# Patient Record
Sex: Female | Born: 2000 | Race: White | Hispanic: No | Marital: Single | Smoking: Never smoker
Health system: Southern US, Community
[De-identification: ages and names within clinical notes are randomized; demographics above are authoritative.]

---
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
## 2019-01-02 ENCOUNTER — Ambulatory Visit (INDEPENDENT_AMBULATORY_CARE_PROVIDER_SITE_OTHER): Payer: Managed Care, Other (non HMO)

## 2019-01-02 ENCOUNTER — Ambulatory Visit (INDEPENDENT_AMBULATORY_CARE_PROVIDER_SITE_OTHER): Payer: Managed Care, Other (non HMO) | Admitting: Cardiovascular Disease

## 2019-01-02 ENCOUNTER — Other Ambulatory Visit: Payer: Self-pay

## 2019-01-02 ENCOUNTER — Encounter: Payer: Self-pay | Admitting: Cardiovascular Disease

## 2019-01-02 VITALS — BP 128/80 | HR 75 | Ht 62.0 in | Wt 116.8 lb

## 2019-01-02 DIAGNOSIS — R002 Palpitations: Secondary | ICD-10-CM

## 2019-01-02 NOTE — Patient Instructions (Signed)
Medication Instructions:  Your physician recommends that you continue on your current medications as directed. Please refer to the Current Medication list given to you today.   If you need a refill on your cardiac medications before your next appointment, please call your pharmacy.   Lab work: None ordered If you have labs (blood work) drawn today and your tests are completely normal, you will receive your results only by: Marland Kitchen MyChart Message (if you have MyChart) OR . A paper copy in the mail If you have any lab test that is abnormal or we need to change your treatment, we will call you to review the results.  Testing/Procedures: Your physician has recommended that you wear an zio monitor. Zio monitors are medical devices that record the heart's electrical activity. Doctors most often [place] these monitors to diagnose arrhythmias. Arrhythmias are problems with the speed or rhythm of the heartbeat. The monitor is a small, portable device. You can wear one while you do your normal daily activities. This is usually used to diagnose what is causing palpitations/syncope (passing out).    Follow-Up: At [facility], you and your health needs are our priority.  As part of our continuing mission to provide you with exceptional heart care, we have created designated Provider Care Teams.  These Care Teams include your primary Cardiologist (physician) and Advanced Practice Providers (APPs -  Physician Assistants and Nurse Practitioners) who all work together to provide you with the care you need, when you need it. You will need a follow up appointment as needed    You may see Dr. Fletcher Anon or one of the following Advanced Practice Providers on your designated Care Team:   Murray Hodgkins, NP Christell Faith, PA-C . Marrianne Mood, PA-C  Any Other Special Instructions Will Be Listed Below (If Applicable).  OK to exercise with no limitations.      Your physician has recommended that you wear a Zio  monitor. This monitor is a medical device that records the heart's electrical activity. Doctors most often use these monitors to diagnose arrhythmias. Arrhythmias are problems with the speed or rhythm of the heartbeat. The monitor is a small device applied to your chest. You can wear one while you do your normal daily activities. While wearing this monitor if you have any symptoms to push the button and record what you felt. Once you have worn this monitor for the period of time provider prescribed (Usually 14 days), you will return the monitor device in the postage paid box. Once it is returned they will download the data collected and provide [place] with a report which the provider will then review and we will call you with those results. Important tips:  1. Avoid showering during the first 24 hours of wearing the monitor. 2. Avoid excessive sweating to help maximize wear time. 3. Do not submerge the device, no hot tubs, and no swimming pools. 4. Keep any lotions or oils away from the patch. 5. After 24 hours you may shower with the patch on. Take brief showers with your back facing the shower head.  6. Do not remove patch once it has been placed because that will interrupt data and decrease adhesive wear time. 7. Push the button when you have any symptoms and write down what you were feeling. 8. Once you have completed wearing your monitor, remove and place into box which has postage paid and place in your outgoing mailbox.  9. If for some reason you have misplaced your box  then call our office and we can provide another box and/or mail it off for you.

## 2019-01-02 NOTE — Progress Notes (Signed)
Cardiology Office Note   Date:  01/02/2019   ID:  Ovid Curd Flanigan, DOB 2000/10/13, MRN [id number]  PCP:  Paulina Fusi, MD  Cardiologist:   Kathlyn Sacramento, MD   Chief Complaint  Patient presents with  . other    Per Posey Pronto due to Hypertension no complaints today. Meds reviewed verbally with pt.      History of Present Illness: Jamie Melendez is a 18 y.o. female who was referred by Dr. Posey Pronto for evaluation of elevated blood pressure.  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  She was hospitalized in February with acute renal failure shortly after suffering from the flu.  The renal failure was felt to be due to dehydration and use of nonsteroidal anti-inflammatory medications.  Renal function improved with hydration and return to normal.  No other issues since then.  The patient was able to resume all her exercises with no limitations.  However, she does complain of mild palpitations described as skipping which happens almost on a daily basis.  She denies any chest pain, shortness of breath or syncope.  She is able to exercise with no limitations with exercise capacity.  She is not a smoker.  She drinks alcohol occasionally.  Family history is remarkable for hypertension. She reports that her blood pressure occasionally runs high when she goes to the physician's office.  She had recent labs done which showed normal TSH, normal CBC and CMP.  Creatinine was normal at 0.7.    History reviewed. No pertinent past medical history.  History reviewed. No pertinent surgical history.   Current Outpatient Medications  Medication Sig Dispense Refill  . Clindamycin-Benzoyl Per, Refr, gel 2 (two) times daily.    . norethindrone-ethinyl estradiol-iron (ESTROSTEP FE,TILIA FE,TRI-LEGEST FE) 1-20/1-30/1-35 MG-MCG tablet Take 1 tablet by mouth daily.    Marland Kitchen tretinoin (RETIN-A) 0.1 % cream Apply 1 application topically at bedtime.     No current facility-administered medications for this  visit.     Allergies:   Penicillins and Amoxicillin    Social History:  The patient  reports that she has never smoked. She has never used smokeless tobacco. She reports current alcohol use. She reports that she does not use drugs.   Family History:  The patient's family history includes Diabetes in her father and paternal grandfather; Hypertension in her father.    ROS:  Please see the history of present illness.   Otherwise, review of systems are positive for none.   All other systems are reviewed and negative.    PHYSICAL EXAM: VS:  BP 128/80 (BP Location: Right Arm, Patient Position: Sitting, Cuff Size: Normal)   Pulse 75   Ht 5\' 2"  (1.575 m)   Wt 116 lb 12 oz (53 kg)   SpO2 99%   BMI 21.35 kg/m  , BMI Body mass index is 21.35 kg/m. GEN: Well nourished, well developed, in no acute distress  HEENT: normal  Neck: no JVD, carotid bruits, or masses Cardiac: RRR; no murmurs, rubs, or gallops,no edema  Respiratory:  clear to auscultation bilaterally, normal work of breathing GI: soft, nontender, nondistended, + BS MS: no deformity or atrophy  Skin: warm and dry, no rash Neuro:  Strength and sensation are intact Psych: euthymic mood, full affect   EKG:  EKG is ordered today. The ekg ordered today demonstrates normal sinus rhythm with RSR prime pattern with no significant ST or T wave changes.  This is a normal variant.   Recent Labs: 12/26/2018:  ALT 9; BUN 15; Creatinine, Ser 0.70; Hemoglobin 13.2; Platelets 301; Potassium 4.0; Sodium 143; TSH 1.060    Lipid Panel No results found for: CHOL, TRIG, HDL, CHOLHDL, VLDL, LDLCALC, LDLDIRECT    Wt Readings from Last 3 Encounters:  01/02/19 116 lb 12 oz (53 kg) (28 %, Z= -0.57)*  06/26/18 115 lb (52.2 kg) (27 %, Z= -0.62)*   * Growth percentiles are based on CDC (Girls, 2-20 Years) data.       No flowsheet data found.    ASSESSMENT AND PLAN:  1.  Palpitations: Likely due to premature beats based on her description.   I advised her to stay well-hydrated with exercise.  I requested a 3-day outpatient monitor to ensure no excessive amount of PVCs.  2.  Mildly elevated blood pressure readings: Her blood pressure is normal today.  However, I did explain to her that her blood pressure is expected to be lower given her age and athletic status.  At the same time, she appears to have a component of whitecoat syndrome.  At this level of blood pressure, no further work-up is needed for this but I did advise her to obtain a blood pressure machine and record blood pressure at rest 3 times per week.  If she has readings above 130, then further evaluation can be considered.  I have cleared the patient to resume exercise with no restrictions given normal cardiac exam and baseline EKG and lack of symptoms with exercise.   Disposition:   FU with me as needed  Signed,  Lorine BearsMuhammad Jden Want, MD  01/02/2019 2:06 PM    [facility]

## 2019-01-05 NOTE — Progress Notes (Signed)
Patient presents with a few episodes of elevated blood pressure by the athletic training saff. BP was checked for her returner PPE. Range 140-130/90-70. She denies any history of elevated blood pressure in the past. She denies any symptoms such as headache, CP, palpitations, vision problems. She denies smoking but does drink alcohol on occasion. Denies taking any supplements or drinking excessive caffeine. Admits she gets nervous when she gets her blood pressure taken in the past. Her father was diagnosed with HTN at an early age so she is concerned and so are her parents.   ROS: Negative except mentioned above. Vitals as per Epic.  GENERAL: NAD, not obese HEENT: no pharyngeal erythema, no exudate, no erythema of TMs, no cervical LAD RESP: CTA B CARD: RRR, no m/r/g ABD: +BS, NT NEURO: CN II-XII grossly intact   A/P: Elevated Blood Pressure Readings - no significant risk factors, likely "white coat hypertension", will do basic lab work including TSH, will consider cardiology evaluation once labs reviewed, seek medical attention if any concerning symptoms develop. Patient addressed understanding

## 2019-01-13 ENCOUNTER — Telehealth: Payer: Self-pay | Admitting: Cardiovascular Disease

## 2019-01-13 NOTE — Telephone Encounter (Signed)
Patient father calling in regarding concerns with daughters health. Since patient appt, pt is still having days with about 15-18 palpitation episodes. Patient had hx with kidney issues and going to ED in March. Patient father is very concerned and would like to be advised on what should be should.

## 2019-01-13 NOTE — Telephone Encounter (Addendum)
Attempted to reach patient. No answer or VM.  Called her father back. He is not on DPR but stated he and his wife have Healthcare proxy. 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 Father went on to tell me about the symptoms patient is continuing to have though he understood I could not give patient's info out at this time. Patient said she counted 50 palpitations on Saturday and counted 30 palpitations on Sunday. Coming and going. Majority of the time she is laying/sitting down relaxing. 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  Track coach at [employer] said they would have her sit out from exercise until results of the monitor are back.  Father denies that patient has any shortness of breath, chest pain, dizziness or any other symptoms along with the palpitations. They are just concerned and looking forward to finding out the next steps in patient's plan of care. Advised that the next step will be having Dr Fletcher Anon review the monitor results and then decide on plan of care.  Advised father to have patient call later this week to ask if the monitor results were back yet and with any additional updates.  Advised that next time patient is in the office make sure she is aware to sign the DPR. Checked on ZIO website and it shows monitor is in status received. I expressed concern and understanding that he and patient's mother are eager to find out what is wrong with their daughter. He was very appreciative of the call.

## 2019-01-14 NOTE — Telephone Encounter (Signed)
Patient has given verbal permission to speak with both parents until patient comes in office to fill out a DPR form

## 2019-01-14 NOTE — Telephone Encounter (Signed)
Noted  

## 2019-01-15 ENCOUNTER — Other Ambulatory Visit: Payer: Self-pay

## 2019-01-15 DIAGNOSIS — Z20822 Contact with and (suspected) exposure to covid-19: Secondary | ICD-10-CM

## 2019-01-16 LAB — NOVEL CORONAVIRUS, NAA: SARS-CoV-2, NAA: DETECTED — AB

## 2019-01-16 NOTE — Telephone Encounter (Signed)
See result note.  

## 2019-01-17 ENCOUNTER — Telehealth: Payer: Self-pay | Admitting: Cardiovascular Disease

## 2019-01-17 NOTE — Telephone Encounter (Signed)
Patient has given verbal permission to discuss monitor results with both parents. [email or web address] for the patient's father Jamie Melendez.

## 2019-01-17 NOTE — Telephone Encounter (Signed)
Please call with monitor results °

## 2019-01-17 NOTE — Telephone Encounter (Signed)
Duplicate encounter. See 01/13/19 telephone encounter for documentation.

## 2019-01-17 NOTE — Telephone Encounter (Signed)
Spoke with the patient's father Sherren Mocha. Patient made aware of the patient;s cardiac monitor results with verbal understanding. Todd voiced appreciation for the call.

## 2019-01-22 NOTE — Telephone Encounter (Signed)
Returned the patients call. Patient rqst that a copy of her zio-monitor report be mailed to her home address listed in Epic. Report printed and placed in the mail. No further action required

## 2019-01-22 NOTE — Telephone Encounter (Signed)
Patient calling Patient would like a call for results as well Patient would also like to know if there is more detailed notes for monitor then what she can see on mychart Please call to discuss

## 2019-01-27 ENCOUNTER — Other Ambulatory Visit: Payer: Self-pay | Admitting: Family Medicine

## 2019-01-27 DIAGNOSIS — Z8616 Personal history of COVID-19: Secondary | ICD-10-CM

## 2019-01-27 DIAGNOSIS — I400 Infective myocarditis: Secondary | ICD-10-CM

## 2019-01-27 DIAGNOSIS — Z8619 Personal history of other infectious and parasitic diseases: Secondary | ICD-10-CM

## 2019-01-29 ENCOUNTER — Other Ambulatory Visit: Payer: Self-pay

## 2019-01-29 ENCOUNTER — Other Ambulatory Visit: Payer: PRIVATE HEALTH INSURANCE | Admitting: Family Medicine

## 2019-01-29 DIAGNOSIS — Z0000 Encounter for general adult medical examination without abnormal findings: Secondary | ICD-10-CM

## 2019-01-30 LAB — TROPONIN I: Troponin I: <0.01 ng/mL (ref 0.00–0.04)

## 2019-02-01 ENCOUNTER — Ambulatory Visit (INDEPENDENT_AMBULATORY_CARE_PROVIDER_SITE_OTHER): Payer: Managed Care, Other (non HMO)

## 2019-02-01 ENCOUNTER — Other Ambulatory Visit: Payer: Self-pay

## 2019-02-01 DIAGNOSIS — Z8619 Personal history of other infectious and parasitic diseases: Secondary | ICD-10-CM

## 2019-02-01 DIAGNOSIS — I400 Infective myocarditis: Secondary | ICD-10-CM

## 2019-02-01 DIAGNOSIS — Z8616 Personal history of COVID-19: Secondary | ICD-10-CM

## 2019-02-01 DIAGNOSIS — U071 COVID-19: Secondary | ICD-10-CM | POA: Diagnosis not present

## 2019-02-01 NOTE — Progress Notes (Addendum)
2D Echocardiogram has been completed. 

## 2019-02-10 ENCOUNTER — Other Ambulatory Visit: Payer: PRIVATE HEALTH INSURANCE

## 2019-02-18 ENCOUNTER — Other Ambulatory Visit: Payer: Self-pay

## 2019-02-18 ENCOUNTER — Ambulatory Visit (INDEPENDENT_AMBULATORY_CARE_PROVIDER_SITE_OTHER): Payer: Managed Care, Other (non HMO) | Admitting: Cardiology

## 2019-02-18 ENCOUNTER — Encounter: Payer: Self-pay | Admitting: Cardiology

## 2019-02-18 VITALS — BP 120/70 | HR 66 | Temp 96.6°F | Ht 62.0 in | Wt 117.5 lb

## 2019-02-18 DIAGNOSIS — R002 Palpitations: Secondary | ICD-10-CM | POA: Diagnosis not present

## 2019-02-18 NOTE — Patient Instructions (Signed)
Medication Instructions:  Your physician recommends that you continue on your current medications as directed. Please refer to the Current Medication list given to you today.  *If you need a refill on your cardiac medications before your next appointment, please call your pharmacy*  Lab Work: NONE If you have labs (blood work) drawn today and your tests are completely normal, you will receive your results only by: Marland Kitchen MyChart Message (if you have MyChart) OR . A paper copy in the mail If you have any lab test that is abnormal or we need to change your treatment, we will call you to review the results.  Testing/Procedures: NONE  Follow-Up: At [facility], you and your health needs are our priority.  As part of our continuing mission to provide you with exceptional heart care, we have created designated Provider Care Teams.  These Care Teams include your primary Cardiologist (physician) and Advanced Practice Providers (APPs -  Physician Assistants and Nurse Practitioners) who all work together to provide you with the care you need, when you need it.  Your next appointment:   as needed.   The format for your next appointment:   In Person  Provider:    You may see Kathlyn Sacramento, MD or one of the following Advanced Practice Providers on your designated Care Team:    Murray Hodgkins, NP  Christell Faith, PA-C  Marrianne Mood, PA-C

## 2019-02-18 NOTE — Progress Notes (Signed)
Cardiology Office Note:    Date:  02/18/2019   ID:  [name], DOB Jun 08, 2000, MRN [id number]  PCP:  No primary care provider on file.  Cardiologist:  Lorine Bears, MD  Electrophysiologist:  None   Referring MD: Jolene Provost, MD   Chief Complaint  Patient presents with  . office visit    F/U after echo    History of Present Illness:    Jamie Melendez is a 18 y.o. female with no significant past medical history who presents for follow-up.  She was originally seen for hypertension and palpitations.  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 Her hypertension was deemed to have an element of whitecoat syndrome.  Echo and cardiac monitor were placed for patient.  She denied chest pain, shortness of breath or syncope.   History reviewed. No pertinent past medical history.  History reviewed. No pertinent surgical history.  Current Medications: Current Meds  Medication Sig  . Clindamycin-Benzoyl Per, Refr, gel 2 (two) times daily.  . norethindrone-ethinyl estradiol-iron (ESTROSTEP FE,TILIA FE,TRI-LEGEST FE) 1-20/1-30/1-35 MG-MCG tablet Take 1 tablet by mouth daily.  Marland Kitchen tretinoin (RETIN-A) 0.1 % cream Apply 1 application topically at bedtime.     Allergies:   Penicillins and Amoxicillin   Social History   Socioeconomic History  . Marital status: Single    Spouse name: Not on file  . Number of children: Not on file  . Years of education: Not on file  . Highest education level: Not on file  Occupational History  . Not on file  Social Needs  . Financial resource strain: Not on file  . Food insecurity    Worry: Not on file    Inability: Not on file  . Transportation needs    Medical: Not on file    Non-medical: Not on file  Tobacco Use  . Smoking status: Never Smoker  . Smokeless tobacco: Never Used  Substance and Sexual Activity  . Alcohol use: Yes    Comment: social  . Drug use: Never  . Sexual activity: Not on file  Lifestyle  . Physical  activity    Days per week: Not on file    Minutes per session: Not on file  . Stress: Not on file  Relationships  . Social Musician on phone: Not on file    Gets together: Not on file    Attends religious service: Not on file    Active member of club or organization: Not on file    Attends meetings of clubs or organizations: Not on file    Relationship status: Not on file  Other Topics Concern  . Not on file  Social History Narrative  . Not on file     Family History: The patient's family history includes Diabetes in her father and paternal grandfather; Hypertension in her father.  ROS:   Please see the history of present illness.     All other systems reviewed and are negative.  EKGs/Labs/Other Studies Reviewed:    The following studies were reviewed today: Echo 02/01/2019 IMPRESSIONS    1. Left ventricular ejection fraction, by visual estimation, is 60 to 65%. The left ventricle has normal function. Normal left ventricular size. There is no left ventricular hypertrophy.  2. Global right ventricle has normal systolic function.The right ventricular size is normal. No increase in right ventricular wall thickness.  3. Left atrial size was normal.  4. Normal pulmonary artery systolic pressure  3-day ZIO patch monitor:  Normal sinus rhythm with an average heart rate of 83 bpm. Rare PVCs overall some of which correlated with triggered events by the patient. No significant arrhythmia.  EKG:  EKG ordered today.  EKG showed normal sinus rhythm, short PR otherwise normal ECG.   Recent Labs: 12/26/2018: ALT 9; BUN 15; Creatinine, Ser 0.70; Hemoglobin 13.2; Platelets 301; Potassium 4.0; Sodium 143; TSH 1.060  Recent Lipid Panel No results found for: CHOL, TRIG, HDL, CHOLHDL, VLDL, LDLCALC, LDLDIRECT  Physical Exam:    VS:  BP 120/70 (BP Location: Left Arm, Patient Position: Sitting, Cuff Size: Normal)   Pulse 66   Temp (!) 96.6 F (35.9 C)   Ht 5\' 2"  (1.575 m)    Wt 117 lb 8 oz (53.3 kg)   SpO2 98%   BMI 21.49 kg/m     Wt Readings from Last 3 Encounters:  02/18/19 117 lb 8 oz (53.3 kg) (29 %, Z= -0.54)*  01/02/19 116 lb 12 oz (53 kg) (28 %, Z= -0.57)*  06/26/18 115 lb (52.2 kg) (27 %, Z= -0.62)*   * Growth percentiles are based on CDC (Girls, 2-20 Years) data.     GEN:  Well nourished, well developed in no acute distress HEENT: Normal NECK: No JVD; No carotid bruits LYMPHATICS: No lymphadenopathy CARDIAC: RRR, no murmurs, rubs, gallops RESPIRATORY:  Clear to auscultation without rales, wheezing or rhonchi  ABDOMEN: Soft, non-tender, non-distended MUSCULOSKELETAL:  No edema; No deformity  SKIN: Warm and dry NEUROLOGIC:  Alert and oriented x 3 PSYCHIATRIC:  Normal affect   ASSESSMENT:   Echocardiogram results showed normal ejection fraction and normal diastolic function, no abnormalities completely normal study.  Zio patch did not show any significant arrhythmia, with rare PVCs.  Blood pressure today is well controlled.  1. Palpitations    PLAN:    Patient can resume athletic activity.  She does not have any cardiac restrictions for athletic activity.  Follow-up as needed with Dr. Fletcher Anon   Medication Adjustments/Labs and Tests Ordered: Current medicines are reviewed at length with the patient today.  Concerns regarding medicines are outlined above.  Orders Placed This Encounter  Procedures  . EKG 12-Lead   No orders of the defined types were placed in this encounter.   Patient Instructions  Medication Instructions:  Your physician recommends that you continue on your current medications as directed. Please refer to the Current Medication list given to you today.  *If you need a refill on your cardiac medications before your next appointment, please call your pharmacy*  Lab Work: NONE If you have labs (blood work) drawn today and your tests are completely normal, you will receive your results only by: Marland Kitchen MyChart Message  (if you have MyChart) OR . A paper copy in the mail If you have any lab test that is abnormal or we need to change your treatment, we will call you to review the results.  Testing/Procedures: NONE  Follow-Up: At [facility], you and your health needs are our priority.  As part of our continuing mission to provide you with exceptional heart care, we have created designated Provider Care Teams.  These Care Teams include your primary Cardiologist (physician) and Advanced Practice Providers (APPs -  Physician Assistants and Nurse Practitioners) who all work together to provide you with the care you need, when you need it.  Your next appointment:   as needed.   The format for your next appointment:   In Person  Provider:    You may see  Lorine BearsMuhammad Arida, MD or one of the following Advanced Practice Providers on your designated Care Team:    Nicolasa Duckinghristopher Berge, NP  Eula Listenyan Dunn, PA-C  Marisue IvanJacquelyn Visser, PA-C      Signed, Debbe OdeaBrian Agbor-Etang, MD  02/18/2019 8:54 AM    [facility]

## 2019-08-08 ENCOUNTER — Other Ambulatory Visit: Payer: Self-pay | Admitting: Sports Medicine

## 2019-08-08 ENCOUNTER — Ambulatory Visit
Admission: RE | Admit: 2019-08-08 | Discharge: 2019-08-08 | Disposition: A | Discharge status: Home / Self Care | Payer: Managed Care, Other (non HMO) | Source: Ambulatory Visit | Attending: Sports Medicine | Admitting: Sports Medicine

## 2019-08-08 ENCOUNTER — Other Ambulatory Visit: Payer: Self-pay

## 2019-08-08 DIAGNOSIS — R52 Pain, unspecified: Secondary | ICD-10-CM

## 2019-08-08 DIAGNOSIS — M898X6 Other specified disorders of bone, lower leg: Secondary | ICD-10-CM | POA: Diagnosis present

## 2020-01-28 IMAGING — CT CT ABD-PELV W/O CM
2 of 4 series · 16 of 46 positions shown, 18 images · non-contrast
Comparison: None.

CLINICAL DATA: 17-year-old female with abdominal pain.

EXAM:
CT ABDOMEN AND PELVIS WITHOUT CONTRAST
TECHNIQUE: Multidetector CT imaging of the abdomen and pelvis was performed
following the standard protocol without IV contrast.

[Series 2: routine abd/pel wo · axial · 0.64mm/px · z∈[-926,-541]mm · 13 of 85 slices shown, 15 images]
[im 4/85  soft-tissue]
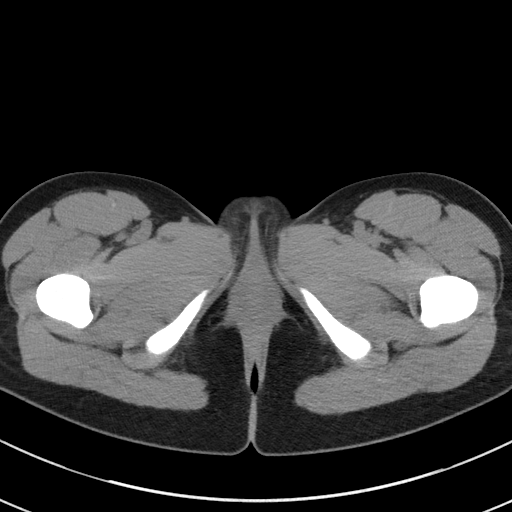
[im 4/85  bone]
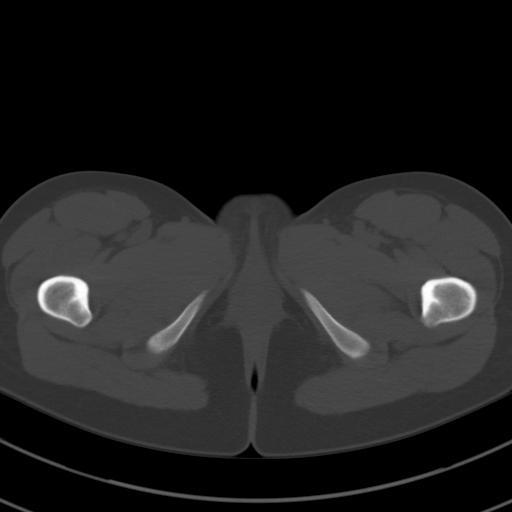
[im 11/85  soft-tissue]
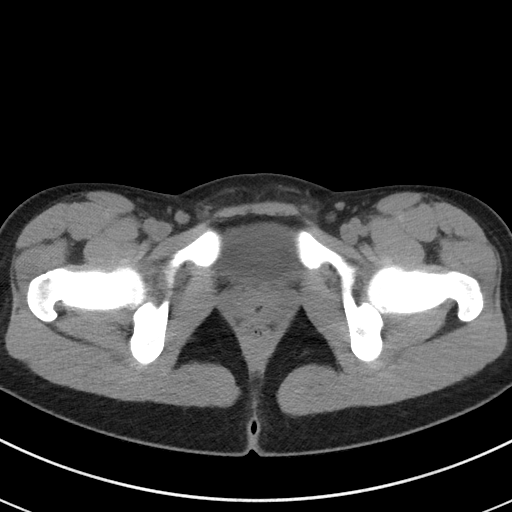
[im 17/85  soft-tissue]
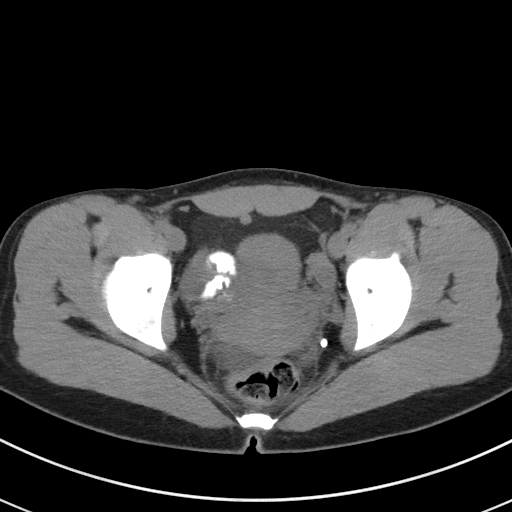
[im 24/85  soft-tissue]
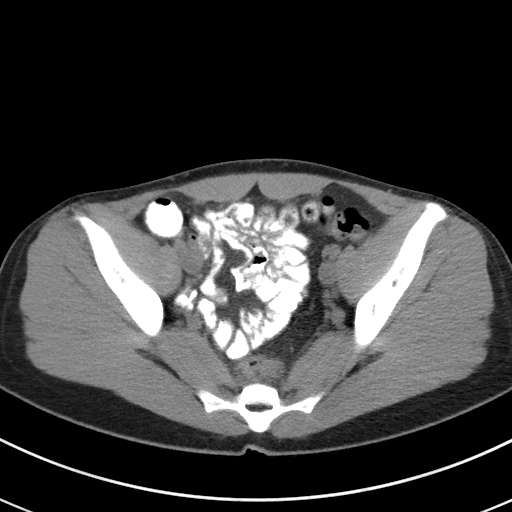
[im 31/85  soft-tissue]
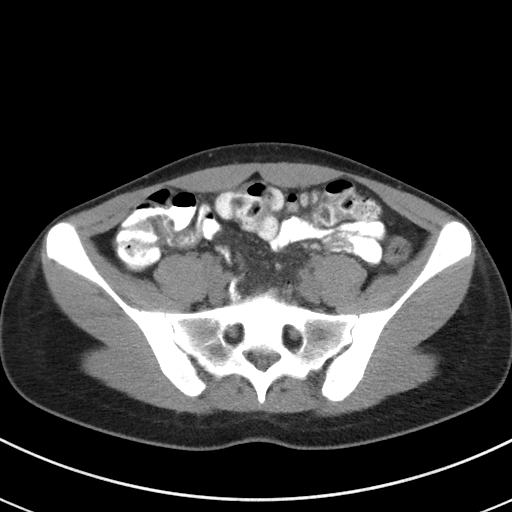
[im 37/85  soft-tissue]
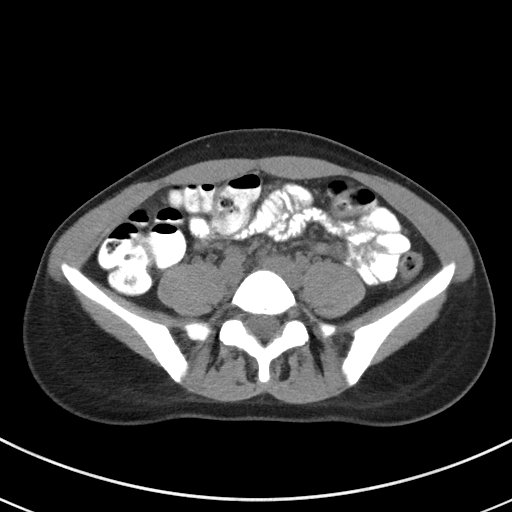
[im 44/85  soft-tissue]
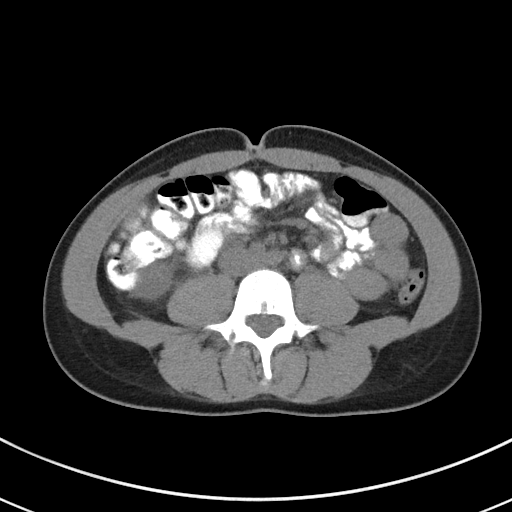
[im 48/85  soft-tissue]
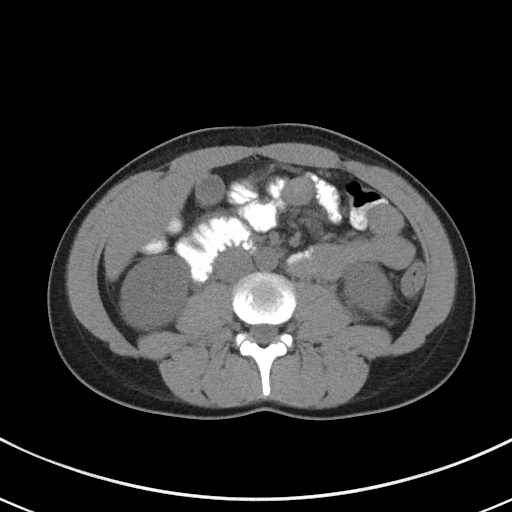
[im 54/85  soft-tissue]
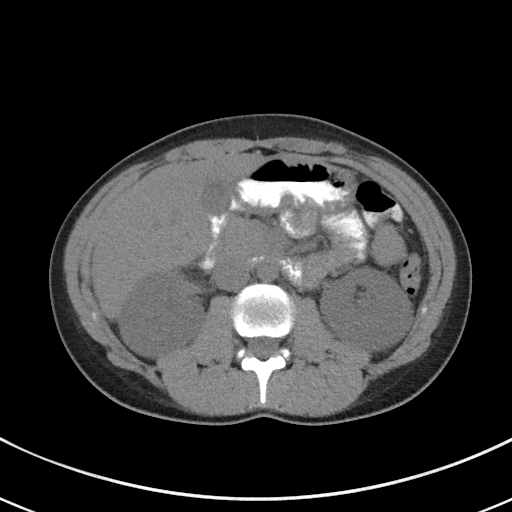
[im 54/85  bone]
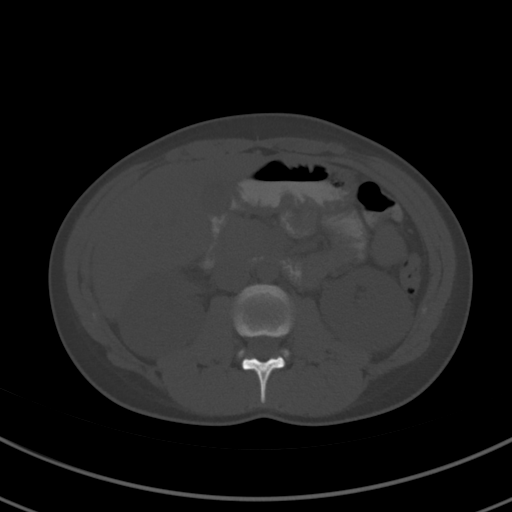
[im 61/85  soft-tissue]
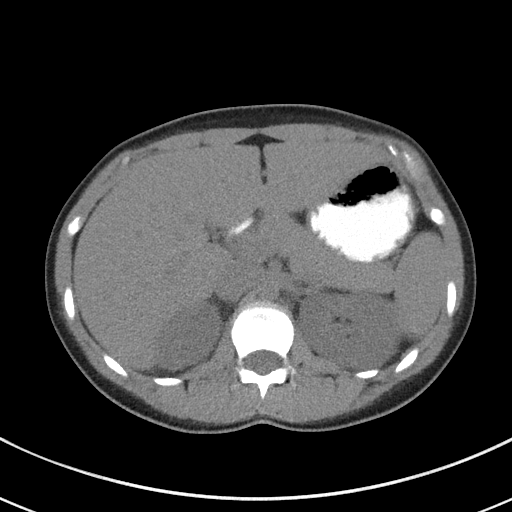
[im 68/85  soft-tissue]
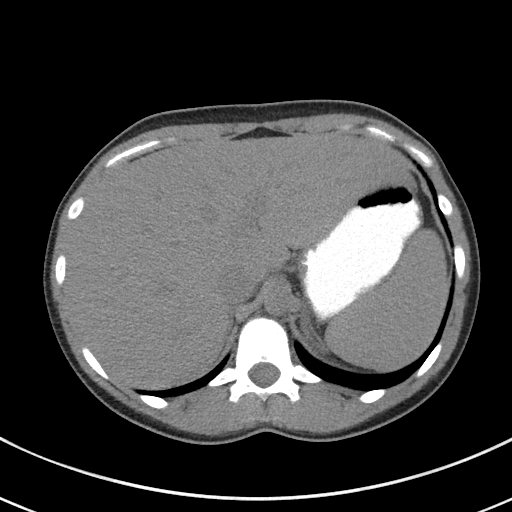
[im 74/85  soft-tissue]
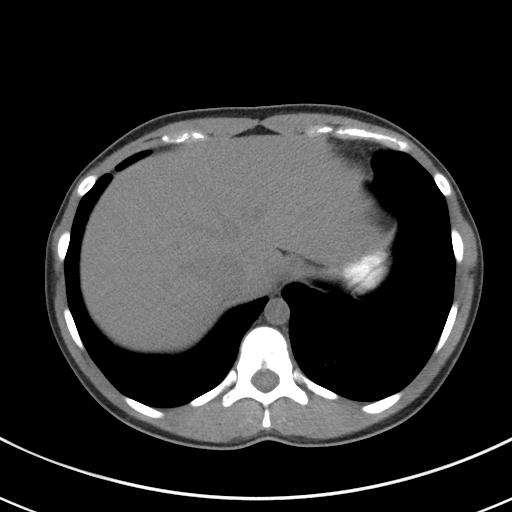
[im 81/85  soft-tissue]
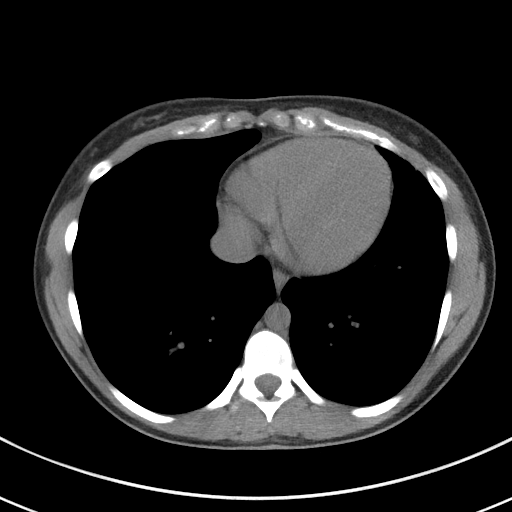

[Series 5: coronal st · coronal · 0.73mm/px · 3 of 68 slices shown]
[im 23/68  soft-tissue]
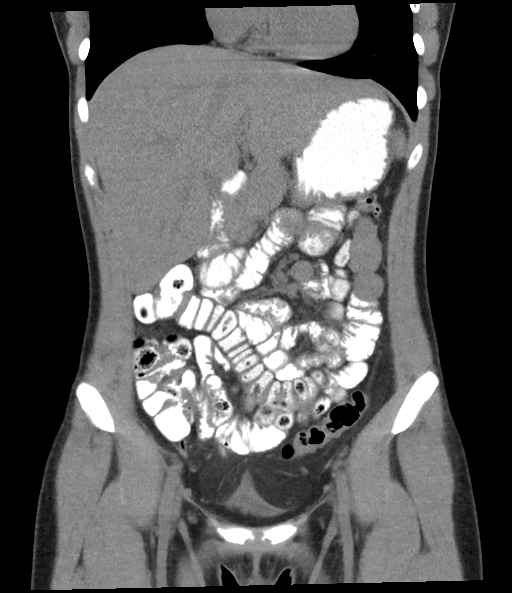
[im 30/68  soft-tissue]
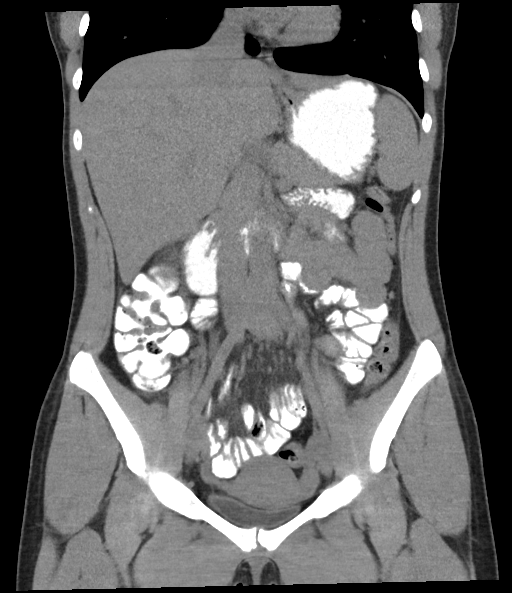
[im 38/68  soft-tissue]
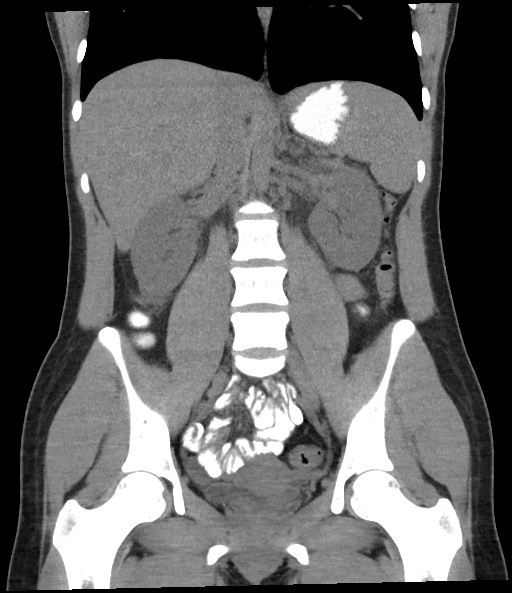

[16 of 46 positions shown; findings below may reference images not displayed]

FINDINGS: Evaluation of this exam is limited in the absence of intravenous
contrast.

Lower chest: Partially visualized probable focal area of atelectasis
in the lingula. The visualized lung bases are otherwise clear.

No intra-abdominal free air. Small free fluid within the pelvis.

Hepatobiliary: No focal liver abnormality is seen. No gallstones,
gallbladder wall thickening, or biliary dilatation.

Pancreas: Unremarkable. No pancreatic ductal dilatation or
surrounding inflammatory changes.

Spleen: Normal in size without focal abnormality.

Adrenals/Urinary Tract: The adrenal glands, kidneys, and the
visualized ureters and urinary bladder appear unremarkable. There is
minimal bilateral perinephric stranding. This is nonspecific.
Correlation with urinalysis recommended to exclude UTI.

Stomach/Bowel: There is no bowel obstruction or active inflammation.
The appendix is normal.

Vascular/Lymphatic: The abdominal aorta and IVC are grossly
unremarkable on this noncontrast CT. No portal venous gas. There is
no adenopathy.

Reproductive: The uterus is anteverted and grossly unremarkable. The
ovaries appear unremarkable as well. No pelvic mass.

Other: None

Musculoskeletal: No acute or significant osseous findings.
IMPRESSION: 1. Minimal bilateral perinephric stranding. Correlation with
urinalysis recommended to exclude UTI.
2. No bowel obstruction or active inflammation. Normal appendix.

## 2020-08-28 ENCOUNTER — Encounter: Payer: Self-pay | Admitting: Radiology

## 2020-08-28 ENCOUNTER — Emergency Department
Admission: EM | Admit: 2020-08-28 | Discharge: 2020-08-28 | Disposition: A | Discharge status: Home / Self Care | Payer: Managed Care, Other (non HMO) | Attending: Emergency Medicine | Admitting: Emergency Medicine

## 2020-08-28 ENCOUNTER — Emergency Department: Payer: Managed Care, Other (non HMO)

## 2020-08-28 ENCOUNTER — Other Ambulatory Visit: Payer: Self-pay

## 2020-08-28 DIAGNOSIS — Z20822 Contact with and (suspected) exposure to covid-19: Secondary | ICD-10-CM | POA: Diagnosis not present

## 2020-08-28 DIAGNOSIS — B349 Viral infection, unspecified: Secondary | ICD-10-CM | POA: Diagnosis not present

## 2020-08-28 DIAGNOSIS — R509 Fever, unspecified: Secondary | ICD-10-CM | POA: Diagnosis present

## 2020-08-28 LAB — URINALYSIS, COMPLETE (UACMP) WITH MICROSCOPIC
Bilirubin Urine: NEGATIVE
Glucose, UA: NEGATIVE mg/dL
Hgb urine dipstick: NEGATIVE
Ketones, ur: NEGATIVE mg/dL
Nitrite: NEGATIVE
Protein, ur: NEGATIVE mg/dL
Specific Gravity, Urine: 1.021 (ref 1.005–1.030)
pH: 8 (ref 5.0–8.0)

## 2020-08-28 LAB — CBC WITH DIFFERENTIAL/PLATELET
Abs Immature Granulocytes: 0.05 10*3/uL (ref 0.00–0.07)
Basophils Absolute: 0 10*3/uL (ref 0.0–0.1)
Basophils Relative: 0 %
Eosinophils Absolute: 0.2 10*3/uL (ref 0.0–0.5)
Eosinophils Relative: 2 %
HCT: 41.9 % (ref 36.0–46.0)
Hemoglobin: 13.9 g/dL (ref 12.0–15.0)
Immature Granulocytes: 1 %
Lymphocytes Relative: 12 %
Lymphs Abs: 1.1 10*3/uL (ref 0.7–4.0)
MCH: 28.3 pg (ref 26.0–34.0)
MCHC: 33.2 g/dL (ref 30.0–36.0)
MCV: 85.3 fL (ref 80.0–100.0)
Monocytes Absolute: 0.9 10*3/uL (ref 0.1–1.0)
Monocytes Relative: 10 %
Neutro Abs: 7 10*3/uL (ref 1.7–7.7)
Neutrophils Relative %: 75 %
Platelets: 319 10*3/uL (ref 150–400)
RBC: 4.91 MIL/uL (ref 3.87–5.11)
RDW: 12 % (ref 11.5–15.5)
WBC: 9.2 10*3/uL (ref 4.0–10.5)
nRBC: 0 % (ref 0.0–0.2)

## 2020-08-28 LAB — RESP PANEL BY RT-PCR (FLU A&B, COVID) ARPGX2
Influenza A by PCR: NEGATIVE
Influenza B by PCR: NEGATIVE
SARS Coronavirus 2 by RT PCR: NEGATIVE

## 2020-08-28 LAB — BASIC METABOLIC PANEL
Anion gap: 9 (ref 5–15)
BUN: 12 mg/dL (ref 6–20)
CO2: 23 mmol/L (ref 22–32)
Calcium: 9.2 mg/dL (ref 8.9–10.3)
Chloride: 103 mmol/L (ref 98–111)
Creatinine, Ser: 0.85 mg/dL (ref 0.44–1.00)
GFR, Estimated: >60 mL/min (ref >60)
Glucose, Bld: 116 mg/dL — ABNORMAL HIGH (ref 70–99)
Potassium: 3.4 mmol/L — ABNORMAL LOW (ref 3.5–5.1)
Sodium: 135 mmol/L (ref 135–145)

## 2020-08-28 LAB — GROUP A STREP BY PCR: Group A Strep by PCR: NOT DETECTED

## 2020-08-28 LAB — PREGNANCY, URINE: Preg Test, Ur: NEGATIVE

## 2020-08-28 LAB — LACTIC ACID, PLASMA: Lactic Acid, Venous: 1.8 mmol/L (ref 0.5–1.9)

## 2020-08-28 LAB — POC URINE PREG, ED: Preg Test, Ur: NEGATIVE

## 2020-08-28 MED ORDER — SODIUM CHLORIDE 0.9 % IV BOLUS
1000.0000 mL | Freq: Once | INTRAVENOUS | Status: AC
  Administered 2020-08-28: 1000 mL via INTRAVENOUS

## 2020-08-28 NOTE — ED Triage Notes (Signed)
Pt states URI symptoms with fever since Thursday. Pt was seen by MD today and tested negative for flu and covid. Pt states at home pta she took tylenol for fever of 104.4. pt denies diarrhea or vomiting. States does have a sore throat.

## 2020-08-28 NOTE — ED Provider Notes (Signed)
[facility] Emergency Department Provider Note ____________________________________________  Time seen: 1945  I have reviewed the triage vital signs and the nursing notes.  HISTORY  Chief Complaint  Fever  HPI Jamie Melendez is a 20 y.o. female with the below medical history, who is a Consulting civil engineer at a [employer], presents to the ED for evaluation of symptoms including fevers , and sore throat.  She was seen today by a local provider, and reports a negative COVID and flu test.  She reports a T-max of 104.4 F, prior to arrival, for which she took Tylenol.  She reports cases of COVID and strep tests being pregnant on the campus.  History reviewed. No pertinent past medical history.  Patient Active Problem List   Diagnosis Date Noted  . Renal failure 06/28/2018  . Acute renal failure (ARF) (HCC) 06/27/2018    No past surgical history on file.  Prior to Admission medications   Medication Sig Start Date End Date Taking? Authorizing Provider  Clindamycin-Benzoyl Per, Refr, gel 2 (two) times daily. 12/06/18   [provider]  norethindrone-ethinyl estradiol-iron (ESTROSTEP FE,TILIA FE,TRI-LEGEST FE) 1-20/1-30/1-35 MG-MCG tablet Take 1 tablet by mouth daily.    [provider]  tretinoin (RETIN-A) 0.1 % cream Apply 1 application topically at bedtime. 05/31/18   [provider]    Allergies Penicillins and Amoxicillin  Family History  Problem Relation Age of Onset  . Hypertension Father   . Diabetes Father   . Diabetes Paternal Grandfather     Social History Social History   Tobacco Use  . Smoking status: Never Smoker  . Smokeless tobacco: Never Used  Vaping Use  . Vaping Use: Never used  Substance Use Topics  . Alcohol use: Yes    Comment: social  . Drug use: Never    Review of Systems  Constitutional: Positive for fever. Eyes: Negative for visual changes. ENT: Positive for sore throat. Cardiovascular: Negative  for chest pain. Respiratory: Negative for shortness of breath. Gastrointestinal: Negative for abdominal pain, vomiting and diarrhea. Genitourinary: Negative for dysuria. Musculoskeletal: Negative for back pain. Skin: Negative for rash. Neurological: Negative for headaches, focal weakness or numbness. ____________________________________________  PHYSICAL EXAM:  VITAL SIGNS: ED Triage Vitals [08/28/20 1927]  Enc Vitals Group     BP (!) 145/92     Pulse Rate (!) 130     Resp 16     Temp (!) 102 F (38.9 C)     Temp Source Oral     SpO2 100 %     Weight 106 lb (48.1 kg)     Height 5\' 2"  (1.575 m)     Head Circumference      Peak Flow      Pain Score 5     Pain Loc      Pain Edu?      Excl. in GC?     Constitutional: Alert and oriented. Well appearing and in no distress. Head: Normocephalic and atraumatic. Eyes: Conjunctivae are normal. PERRL. Normal extraocular movements Ears: Canals clear. TMs intact bilaterally. Nose: No congestion/rhinorrhea/epistaxis. Mouth/Throat: Mucous membranes are moist.  Uvula is midline and tonsils are flat.  No oropharyngeal lesions are appreciated. Neck: Supple. No thyromegaly. Hematological/Lymphatic/Immunological: No cervical lymphadenopathy. Cardiovascular: Normal rate, regular rhythm. Normal distal pulses. Respiratory: Normal respiratory effort. No wheezes/rales/rhonchi. Gastrointestinal: Soft and nontender. No distention rebound, guarding, or rigidity.  Active bowel sounds appreciated.  No CVA tenderness elicited. Musculoskeletal: Nontender with normal range of motion in all extremities.  Neurologic:  Normal gait without ataxia. Normal speech and language. No gross focal neurologic deficits are appreciated. Skin:  Skin is warm, dry and intact. No rash noted. Psychiatric: Mood and affect are normal. Patient exhibits appropriate insight and judgment. ____________________________________________   LABS (pertinent positives/negatives) Labs  Reviewed  BASIC METABOLIC PANEL - Abnormal; Notable for the following components:      Result Value   Potassium 3.4 (*)    Glucose, Bld 116 (*)    All other components within normal limits  URINALYSIS, COMPLETE (UACMP) WITH MICROSCOPIC - Abnormal; Notable for the following components:   Color, Urine YELLOW (*)    APPearance HAZY (*)    Leukocytes,Ua SMALL (*)    Bacteria, UA RARE (*)    All other components within normal limits  GROUP A STREP BY PCR  RESP PANEL BY RT-PCR (FLU A&B, COVID) ARPGX2  CBC WITH DIFFERENTIAL/PLATELET  LACTIC ACID, PLASMA  PREGNANCY, URINE  POC URINE PREG, ED  ____________________________________________   CXR  IMPRESSION: 1. No acute intrathoracic process. ____________________________________________  PROCEDURES  NS bolus 1000 ml IVP  Procedures ____________________________________________  INITIAL IMPRESSION / ASSESSMENT AND PLAN / ED COURSE  As part of my medical decision making, I reviewed the following data within the electronic MEDICAL RECORD NUMBER Labs reviewed WNL, Radiograph reviewed WNL and Notes from prior ED visits   DDX: viral URI, strep throat, influenza, Covid   Female patient ED evaluation of fever with multiple communicable disease contacts.  She was evaluated for her symptoms including blood work and viral screening and chest x-ray.  All reassuring at this time.  Patient without signs of dehydration, electrolyte abnormality, toxic appearance, or acute infectious process.  X-ray negative for any acute intrathoracic findings.  Viral screening strep is also negative at this time.  Patient reports provement of her symptoms after fluid bolus and is stable for discharge at this time.  She will continue to monitor symptoms and return to the ED if necessary.  Jamie Melendez was evaluated in Emergency Department on 08/31/2020 for the symptoms described in the history of present illness. She was evaluated in the context of the global COVID-19  pandemic, which necessitated consideration that the patient might be at risk for infection with the SARS-CoV-2 virus that causes COVID-19. Institutional protocols and algorithms that pertain to the evaluation of patients at risk for COVID-19 are in a state of rapid change based on information released by regulatory bodies including the CDC and federal and state organizations. These policies and algorithms were followed during the patient's care in the ED. ____________________________________________  FINAL CLINICAL IMPRESSION(S) / ED DIAGNOSES  Final diagnoses:  Viral illness      Lissa Hoard, PA-C 08/31/20 0033    Chesley Noon, MD 09/02/20 [phone]

## 2020-08-28 NOTE — Discharge Instructions (Signed)
Your labs, exam, and chest x-ray are normal normal reassuring at this time.  Your symptoms likely represent a viral etiology.  Your COVID/flu, strep test, and chest x-ray are all reassuring at this time.  Your monotest is pending at the time of this disposition.  You should continue to monitor and treat any fevers as necessary.  Hydrate to prevent dehydration.  Follow-up with your primary provider or return to the ED if needed.

## 2020-09-02 ENCOUNTER — Observation Stay: Payer: Managed Care, Other (non HMO)

## 2020-09-02 ENCOUNTER — Other Ambulatory Visit: Payer: Self-pay

## 2020-09-02 ENCOUNTER — Emergency Department: Payer: Managed Care, Other (non HMO)

## 2020-09-02 ENCOUNTER — Observation Stay
Admission: EM | Admit: 2020-09-02 | Discharge: 2020-09-04 | Disposition: A | Discharge status: Home / Self Care | Payer: Managed Care, Other (non HMO) | Attending: Internal Medicine | Admitting: Internal Medicine

## 2020-09-02 DIAGNOSIS — Z79899 Other long term (current) drug therapy: Secondary | ICD-10-CM | POA: Diagnosis not present

## 2020-09-02 DIAGNOSIS — Z20822 Contact with and (suspected) exposure to covid-19: Secondary | ICD-10-CM | POA: Insufficient documentation

## 2020-09-02 DIAGNOSIS — R778 Other specified abnormalities of plasma proteins: Secondary | ICD-10-CM | POA: Insufficient documentation

## 2020-09-02 DIAGNOSIS — I514 Myocarditis, unspecified: Secondary | ICD-10-CM | POA: Diagnosis not present

## 2020-09-02 DIAGNOSIS — J189 Pneumonia, unspecified organism: Secondary | ICD-10-CM

## 2020-09-02 DIAGNOSIS — J181 Lobar pneumonia, unspecified organism: Principal | ICD-10-CM | POA: Insufficient documentation

## 2020-09-02 DIAGNOSIS — R0602 Shortness of breath: Secondary | ICD-10-CM | POA: Diagnosis present

## 2020-09-02 DIAGNOSIS — R7989 Other specified abnormal findings of blood chemistry: Secondary | ICD-10-CM

## 2020-09-02 LAB — URINALYSIS, COMPLETE (UACMP) WITH MICROSCOPIC
Bilirubin Urine: NEGATIVE
Glucose, UA: NEGATIVE mg/dL
Hgb urine dipstick: NEGATIVE
Ketones, ur: NEGATIVE mg/dL
Nitrite: NEGATIVE
Protein, ur: 30 mg/dL — AB
Specific Gravity, Urine: 1.028 (ref 1.005–1.030)
pH: 5 (ref 5.0–8.0)

## 2020-09-02 LAB — CBC WITH DIFFERENTIAL/PLATELET
Abs Immature Granulocytes: 0.03 10*3/uL (ref 0.00–0.07)
Basophils Absolute: 0 10*3/uL (ref 0.0–0.1)
Basophils Relative: 0 %
Eosinophils Absolute: 0.2 10*3/uL (ref 0.0–0.5)
Eosinophils Relative: 3 %
HCT: 36.9 % (ref 36.0–46.0)
Hemoglobin: 12.3 g/dL (ref 12.0–15.0)
Immature Granulocytes: 1 %
Lymphocytes Relative: 39 %
Lymphs Abs: 2.3 10*3/uL (ref 0.7–4.0)
MCH: 28 pg (ref 26.0–34.0)
MCHC: 33.3 g/dL (ref 30.0–36.0)
MCV: 83.9 fL (ref 80.0–100.0)
Monocytes Absolute: 0.5 10*3/uL (ref 0.1–1.0)
Monocytes Relative: 8 %
Neutro Abs: 2.9 10*3/uL (ref 1.7–7.7)
Neutrophils Relative %: 49 %
Platelets: 340 10*3/uL (ref 150–400)
RBC: 4.4 MIL/uL (ref 3.87–5.11)
RDW: 12 % (ref 11.5–15.5)
Smear Review: NORMAL
WBC: 5.5 10*3/uL (ref 4.0–10.5)
nRBC: 0 % (ref 0.0–0.2)

## 2020-09-02 LAB — COMPREHENSIVE METABOLIC PANEL
ALT: 18 U/L (ref 0–44)
AST: 29 U/L (ref 15–41)
Albumin: 3.5 g/dL (ref 3.5–5.0)
Alkaline Phosphatase: 70 U/L (ref 38–126)
Anion gap: 12 (ref 5–15)
BUN: 12 mg/dL (ref 6–20)
CO2: 26 mmol/L (ref 22–32)
Calcium: 8.8 mg/dL — ABNORMAL LOW (ref 8.9–10.3)
Chloride: 99 mmol/L (ref 98–111)
Creatinine, Ser: 0.62 mg/dL (ref 0.44–1.00)
GFR, Estimated: >60 mL/min (ref >60)
Glucose, Bld: 94 mg/dL (ref 70–99)
Potassium: 3.3 mmol/L — ABNORMAL LOW (ref 3.5–5.1)
Sodium: 137 mmol/L (ref 135–145)
Total Bilirubin: 0.5 mg/dL (ref 0.3–1.2)
Total Protein: 7.1 g/dL (ref 6.5–8.1)

## 2020-09-02 LAB — HIV ANTIBODY (ROUTINE TESTING W REFLEX): HIV Screen 4th Generation wRfx: NONREACTIVE

## 2020-09-02 LAB — CBC
HCT: 33.9 % — ABNORMAL LOW (ref 36.0–46.0)
Hemoglobin: 11.4 g/dL — ABNORMAL LOW (ref 12.0–15.0)
MCH: 28.3 pg (ref 26.0–34.0)
MCHC: 33.6 g/dL (ref 30.0–36.0)
MCV: 84.1 fL (ref 80.0–100.0)
Platelets: 319 10*3/uL (ref 150–400)
RBC: 4.03 MIL/uL (ref 3.87–5.11)
RDW: 11.9 % (ref 11.5–15.5)
WBC: 6 10*3/uL (ref 4.0–10.5)
nRBC: 0 % (ref 0.0–0.2)

## 2020-09-02 LAB — POC URINE PREG, ED: Preg Test, Ur: NEGATIVE

## 2020-09-02 LAB — RESP PANEL BY RT-PCR (FLU A&B, COVID) ARPGX2
Influenza A by PCR: NEGATIVE
Influenza B by PCR: NEGATIVE
SARS Coronavirus 2 by RT PCR: NEGATIVE

## 2020-09-02 LAB — TROPONIN I (HIGH SENSITIVITY)
Troponin I (High Sensitivity): 1784 ng/L (ref <18)
Troponin I (High Sensitivity): 2230 ng/L (ref <18)

## 2020-09-02 LAB — CREATININE, SERUM
Creatinine, Ser: 0.6 mg/dL (ref 0.44–1.00)
GFR, Estimated: >60 mL/min (ref >60)

## 2020-09-02 MED ORDER — ONDANSETRON HCL 4 MG/2ML IJ SOLN: 4.0000 mg | Freq: Four times a day (QID) | INTRAMUSCULAR | Status: DC | PRN

## 2020-09-02 MED ORDER — ONDANSETRON HCL 4 MG PO TABS: 4.0000 mg | ORAL_TABLET | Freq: Four times a day (QID) | ORAL | Status: DC | PRN

## 2020-09-02 MED ORDER — ALBUTEROL SULFATE (2.5 MG/3ML) 0.083% IN NEBU
2.5000 mg | INHALATION_SOLUTION | Freq: Four times a day (QID) | RESPIRATORY_TRACT | Status: DC
  Administered 2020-09-02 – 2020-09-03 (×5): 2.5 mg via RESPIRATORY_TRACT
  Filled 2020-09-02 (×5): qty 3

## 2020-09-02 MED ORDER — SODIUM CHLORIDE 0.9 % IV SOLN
500.0000 mg | Freq: Once | INTRAVENOUS | Status: AC
  Administered 2020-09-02: 500 mg via INTRAVENOUS
  Filled 2020-09-02: qty 500

## 2020-09-02 MED ORDER — SODIUM CHLORIDE 0.9 % IV SOLN
500.0000 mg | INTRAVENOUS | Status: DC
  Filled 2020-09-02: qty 500

## 2020-09-02 MED ORDER — IOHEXOL 350 MG/ML SOLN
75.0000 mL | Freq: Once | INTRAVENOUS | Status: AC | PRN
  Administered 2020-09-02: 75 mL via INTRAVENOUS

## 2020-09-02 MED ORDER — ACETAMINOPHEN 325 MG PO TABS: 650.0000 mg | ORAL_TABLET | Freq: Four times a day (QID) | ORAL | Status: DC | PRN

## 2020-09-02 MED ORDER — SODIUM CHLORIDE 0.9 % IV SOLN
2.0000 g | INTRAVENOUS | Status: DC
  Administered 2020-09-03: 2 g via INTRAVENOUS
  Filled 2020-09-02: qty 20
  Filled 2020-09-02: qty 2

## 2020-09-02 MED ORDER — SENNOSIDES-DOCUSATE SODIUM 8.6-50 MG PO TABS: 1.0000 | ORAL_TABLET | Freq: Every evening | ORAL | Status: DC | PRN

## 2020-09-02 MED ORDER — ACETAMINOPHEN 650 MG RE SUPP: 650.0000 mg | Freq: Four times a day (QID) | RECTAL | Status: DC | PRN

## 2020-09-02 MED ORDER — ENOXAPARIN SODIUM 40 MG/0.4ML IJ SOSY
40.0000 mg | PREFILLED_SYRINGE | INTRAMUSCULAR | Status: DC
  Filled 2020-09-02: qty 0.4

## 2020-09-02 MED ORDER — POTASSIUM CHLORIDE CRYS ER 20 MEQ PO TBCR
40.0000 meq | EXTENDED_RELEASE_TABLET | Freq: Once | ORAL | Status: AC
  Administered 2020-09-02: 40 meq via ORAL
  Filled 2020-09-02: qty 2

## 2020-09-02 MED ORDER — SODIUM CHLORIDE 0.9 % IV SOLN
1.0000 g | Freq: Once | INTRAVENOUS | Status: AC
  Administered 2020-09-02: 1 g via INTRAVENOUS
  Filled 2020-09-02: qty 10

## 2020-09-02 MED ORDER — GUAIFENESIN ER 600 MG PO TB12
600.0000 mg | ORAL_TABLET | Freq: Two times a day (BID) | ORAL | Status: DC
  Administered 2020-09-02 – 2020-09-04 (×4): 600 mg via ORAL
  Filled 2020-09-02 (×4): qty 1

## 2020-09-02 NOTE — ED Notes (Signed)
Pt to ED for abnormal d dimer at [facility] office. Pt denies CP, SOB.

## 2020-09-02 NOTE — ED Provider Notes (Signed)
[facility] Emergency Department Provider Note  Time seen: 5:32 PM  I have reviewed the triage vital signs and the nursing notes.   HISTORY  Chief Complaint Shortness of Breath   HPI Jamie Melendez is a 20 y.o. female with no significant past medical history presents to the emergency department for cough shortness of breath and elevated D-dimer.  According to the patient for the past week or so she has been experiencing fever as high as 104 at home cough fever shortness of breath.  Was seen in the emergency department approximately 5 days ago for the same had a negative work-up.  Patient states she was seen by her doctor 2 days ago and had additional lab work performed including a monotest as well as a D-dimer which came back elevated.  Patient was called today and told to come to the emergency department for CT scan of the chest.  Patient states she continues to have shortness of breath especially with exertion as well as cough.   History reviewed. No pertinent past medical history.  Patient Active Problem List   Diagnosis Date Noted  . Renal failure 06/28/2018  . Acute renal failure (ARF) (HCC) 06/27/2018    History reviewed. No pertinent surgical history.  Prior to Admission medications   Medication Sig Start Date End Date Taking? Authorizing Provider  Clindamycin-Benzoyl Per, Refr, gel 2 (two) times daily. 12/06/18   [provider]  norethindrone-ethinyl estradiol-iron (ESTROSTEP FE,TILIA FE,TRI-LEGEST FE) 1-20/1-30/1-35 MG-MCG tablet Take 1 tablet by mouth daily.    [provider]  tretinoin (RETIN-A) 0.1 % cream Apply 1 application topically at bedtime. 05/31/18   [provider]    Allergies  Allergen Reactions  . Penicillins     unknown   . Amoxicillin Rash    Family History  Problem Relation Age of Onset  . Hypertension Father   . Diabetes Father   . Diabetes Paternal Grandfather     Social History Social  History   Tobacco Use  . Smoking status: Never Smoker  . Smokeless tobacco: Never Used  Vaping Use  . Vaping Use: Never used  Substance Use Topics  . Alcohol use: Yes    Comment: social  . Drug use: Never    Review of Systems Constitutional: Fever as high as 104 at home over the past 1 to 1.5 weeks. Cardiovascular: Intermittent chest pains. Respiratory: Positive shortness of breath with occasional cough. Gastrointestinal: Negative for abdominal pain, vomiting Musculoskeletal: Negative for musculoskeletal complaints Neurological: Negative for headache All other ROS negative  ____________________________________________   PHYSICAL EXAM:  VITAL SIGNS: ED Triage Vitals  Enc Vitals Group     BP 09/02/20 1515 129/89     Pulse Rate 09/02/20 1515 87     Resp 09/02/20 1515 20     Temp 09/02/20 1535 98.2 F (36.8 C)     Temp Source 09/02/20 1535 Oral     SpO2 09/02/20 1515 98 %     Weight 09/02/20 1517 105 lb (47.6 kg)     Height 09/02/20 1517 5\' 2"  (1.575 m)     Head Circumference --      Peak Flow --      Pain Score 09/02/20 1517 0     Pain Loc --      Pain Edu? --      Excl. in GC? --    Constitutional: Alert and oriented. Well appearing and in no distress. Eyes: Normal exam ENT  Head: Normocephalic and atraumatic.      Mouth/Throat: Mucous membranes are moist. Cardiovascular: Normal rate, regular rhythm. Respiratory: Normal respiratory effort without tachypnea nor retractions. Breath sounds are clear Gastrointestinal: Soft and nontender. No distention. Musculoskeletal: Nontender with normal range of motion in all extremities.. Neurologic:  Normal speech and language. No gross focal neurologic deficits Skin:  Skin is warm, dry and intact.  Psychiatric: Mood and affect are normal  ____________________________________________    EKG  EKG viewed and interpreted by myself shows a normal sinus rhythm 89 bpm with a narrow QRS, normal axis, largely normal  intervals patient does have inferolateral T wave changes/inversions.  ____________________________________________    RADIOLOGY  CTA of the chest shows a right lower lobe pneumonia.  No PE.  ____________________________________________   INITIAL IMPRESSION / ASSESSMENT AND PLAN / ED COURSE  Pertinent labs & imaging results that were available during my care of the patient were reviewed by me and considered in my medical decision making (see chart for details).   Patient presents emergency department for 1.5 weeks of upper respiratory symptoms including cough congestion fever shortness of breath especially with exertion.  Patient states fever as high as 104 at home but that has since resolved.  Patient states she is actually feeling better but continues to have shortness of breath especially with exertion.  Patient had elevated D-dimer at her PCP and was referred to the emergency department for CTA of the chest.  Patient's lab work in the emergency department shows a significant troponin elevation of 1700 likely indicating a myocarditis.  Patient CTA of the chest is negative for PE but does show a moderate size right lower lobe pneumonia.  Reassuringly patient's vitals are largely within normal limits, white blood cell count is normal.  However given the patient's symptoms shortness of breath with exertion and significant troponin elevation I spoke to Dr. Mariah Milling of cardiology.  We will admit the patient to the hospital check blood cultures, started on antibiotics and obtain an echocardiogram.  Patient agreeable to plan of care.  Jamie Melendez was evaluated in Emergency Department on 09/02/2020 for the symptoms described in the history of present illness. She was evaluated in the context of the global COVID-19 pandemic, which necessitated consideration that the patient might be at risk for infection with the SARS-CoV-2 virus that causes COVID-19. Institutional protocols and algorithms that pertain to  the evaluation of patients at risk for COVID-19 are in a state of rapid change based on information released by regulatory bodies including the CDC and federal and state organizations. These policies and algorithms were followed during the patient's care in the ED.  ____________________________________________   FINAL CLINICAL IMPRESSION(S) / ED DIAGNOSES  Right lower lobe pneumonia Troponin elevation Myocarditis   Minna Antis, MD 09/02/20 1736

## 2020-09-02 NOTE — ED Notes (Signed)
rn aware bed assigned

## 2020-09-02 NOTE — ED Triage Notes (Signed)
Pt told to come ER for elevated DDimer. Pt with SOB with running, but not with normal walking. Pt with c/o back pain last evening. Pt with some chest pain on Tuesday. Pt with some back pain still today.

## 2020-09-02 NOTE — H&P (Signed)
History and Physical    Jamie Melendez JYN:[id number] DOB: 2000-07-21 DOA: 09/02/2020  PCP: Rolanda Lundborg, MD   Patient coming from: PCP office  Chief Complaint  Patient presents with  . Shortness of Breath     HPI: Jamie Melendez is a 20 y.o. female with no significant medical history presents to the ED for evaluation of elevated D-dimer cough and shortness of breath.  Has been having upper respiratory symptoms since last Thursday, with fever up to 104 at home, cough, shortness of breath, was seen in the ED about 5 days ago and had negative work-up.Patient was seen again by her doctor 2 days ago and had additional lab work done including monotest,D-dimer that came back abnormal and referred to the ED for CT scan. Patient has had shortness of breath mostly with running but not with normal walking and had back pain last evening and chest pain 2 days ago.Last fever was 101.3 yesterday. Patient otherwise denies any nausea, vomiting, headache, focal weakness, numbness tingling, speech difficulties. She is not sleeping well, could not lay flat last night.No leg swelling however. currently has no complaints except for feeling tired.  ED Course: Vitals stable, afebrile, saturating well on room air.  Routine labs are stable but troponin was significantly elevated at 1700s, COVID-19 screen negative.  EKG was done personally reviewed shows normal sinus rhythm, inferolateral T wave changes.  Dr. Kennedy Bucker from cardiology was discussed by cardiology and he advised echocardiogram to evaluate.  CT angio did not show any PE Showed right lower lobe pneumonia, reactive right hilar and subcarinal lymphadenopathy.  It was a technically adequate evaluation of the pulmonary vasculature.  Patient placed in [place] as admission and admission was requested.  CTA "1. Right lower lobe pneumonia.2. Reactive right hilar and subcarinal lymphadenopathy. 3. No evidence of pulmonary embolus"  Review of Systems: All systems were reviewed  and were negative except as mentioned in HPI above. Negative for diarrhea Negative for chest pain Negative for focal weakness  History reviewed. No pertinent past medical history.  History reviewed. No pertinent surgical history.   reports that she has never smoked. She has never used smokeless tobacco. She reports current alcohol use. She reports that she does not use drugs.  Allergies  Allergen Reactions  . Penicillins     unknown   . Amoxicillin Rash    Family History  Problem Relation Age of Onset  . Hypertension Father   . Diabetes Father   . Diabetes Paternal Grandfather      Prior to Admission medications   Medication Sig Start Date End Date Taking? Authorizing Provider  Clindamycin-Benzoyl Per, Refr, gel 2 (two) times daily. 12/06/18   [provider]  norethindrone-ethinyl estradiol-iron (ESTROSTEP FE,TILIA FE,TRI-LEGEST FE) 1-20/1-30/1-35 MG-MCG tablet Take 1 tablet by mouth daily.    [provider]  tretinoin (RETIN-A) 0.1 % cream Apply 1 application topically at bedtime. 05/31/18   [provider]    Physical Exam: Vitals:   09/02/20 1700 09/02/20 1730 09/02/20 1800 09/02/20 1819  BP: (!) 137/95 134/84 131/81   Pulse: 99 94 86 93  Resp: 17 17 18  (!) 21  Temp:    98 F (36.7 C)  TempSrc:      SpO2: 100% 100% 100% 100%  Weight:      Height:        General exam: AAOx3 , NAD, weak appearing. HEENT:Oral mucosa moist, Ear/Nose WNL grossly, dentition normal. Respiratory system: bilaterally clear, mild crackles on rt base,no wheezing or  crackles,no use of accessory muscle Cardiovascular system: S1 & S2 +, No JVD,. Gastrointestinal system: Abdomen soft, NT,ND, BS+ Nervous System:Alert, awake, moving extremities and grossly nonfocal Extremities: No edema, distal peripheral pulses palpable.  Skin: No rashes,no icterus. MSK: Normal muscle bulk,tone, power   Labs on Admission: I have personally reviewed following labs and imaging  studies  CBC: Recent Labs  Lab 08/28/20 1933 09/02/20 1526  WBC 9.2 5.5  NEUTROABS 7.0 2.9  HGB 13.9 12.3  HCT 41.9 36.9  MCV 85.3 83.9  PLT 319 340   Basic Metabolic Panel: Recent Labs  Lab 08/28/20 1933 09/02/20 1526  NA 135 137  K 3.4* 3.3*  CL 103 99  CO2 23 26  GLUCOSE 116* 94  BUN 12 12  CREATININE 0.85 0.62  CALCIUM 9.2 8.8*   GFR: Estimated Creatinine Clearance: 83.6 mL/min (by C-G formula based on SCr of 0.62 mg/dL). Liver Function Tests: Recent Labs  Lab 09/02/20 1526  AST 29  ALT 18  ALKPHOS 70  BILITOT 0.5  PROT 7.1  ALBUMIN 3.5   No results for input(s): LIPASE, AMYLASE in the last 168 hours. No results for input(s): AMMONIA in the last 168 hours. Coagulation Profile: No results for input(s): INR, PROTIME in the last 168 hours. Cardiac Enzymes: No results for input(s): CKTOTAL, CKMB, CKMBINDEX, TROPONINI in the last 168 hours. BNP (last 3 results) No results for input(s): PROBNP in the last 8760 hours. HbA1C: No results for input(s): HGBA1C in the last 72 hours. CBG: No results for input(s): GLUCAP in the last 168 hours. Lipid Profile: No results for input(s): CHOL, HDL, LDLCALC, TRIG, CHOLHDL, LDLDIRECT in the last 72 hours. Thyroid Function Tests: No results for input(s): TSH, T4TOTAL, FREET4, T3FREE, THYROIDAB in the last 72 hours. Anemia Panel: No results for input(s): VITAMINB12, FOLATE, FERRITIN, TIBC, IRON, RETICCTPCT in the last 72 hours. Urine analysis:    Component Value Date/Time   COLORURINE YELLOW (A) 09/02/2020 1528   APPEARANCEUR CLOUDY (A) 09/02/2020 1528   LABSPEC 1.028 09/02/2020 1528   PHURINE 5.0 09/02/2020 1528   GLUCOSEU NEGATIVE 09/02/2020 1528   HGBUR NEGATIVE 09/02/2020 1528   BILIRUBINUR NEGATIVE 09/02/2020 1528   KETONESUR NEGATIVE 09/02/2020 1528   PROTEINUR 30 (A) 09/02/2020 1528   NITRITE NEGATIVE 09/02/2020 1528   LEUKOCYTESUR MODERATE (A) 09/02/2020 1528    Radiological Exams on Admission: CT  Angio Chest PE W and/or Wo Contrast  Result Date: 09/02/2020 CLINICAL DATA:  Short of breath, elevated D-dimer, back pain EXAM: CT ANGIOGRAPHY CHEST WITH CONTRAST TECHNIQUE: Multidetector CT imaging of the chest was performed using the standard protocol during bolus administration of intravenous contrast. Multiplanar CT image reconstructions and MIPs were obtained to evaluate the vascular anatomy. CONTRAST:  66mL OMNIPAQUE IOHEXOL 350 MG/ML SOLN COMPARISON:  08/28/2020 FINDINGS: Cardiovascular: This is a technically adequate evaluation of the pulmonary vasculature. No filling defects or pulmonary emboli. The heart is unremarkable without pericardial effusion. No evidence of thoracic aortic aneurysm or dissection. Mediastinum/Nodes: Right hilar and subcarinal adenopathy likely reactive. Largest lymph node measuring 10 mm in short axis. Thyroid, trachea, and esophagus are unremarkable. Lungs/Pleura: There is dense right lower lobe airspace disease involving the right posterior costophrenic angle, compatible with pneumonia. No effusion or pneumothorax. Central airways are patent. Upper Abdomen: No acute abnormality. Musculoskeletal: No acute or destructive bony lesions. Reconstructed images demonstrate no additional findings. Review of the MIP images confirms the above findings. IMPRESSION: 1. Right lower lobe pneumonia. 2. Reactive right hilar and subcarinal lymphadenopathy. 3.  No evidence of pulmonary embolus. Electronically Signed   By: Sharlet Salina M.D.   On: 09/02/2020 17:08     Assessment/Plan  RLL pneumonia: Symptom onset for a week or so with fever cough shortness of breath.  Currently afebrile, patient not hypoxic.  Significant leukocytosis.  We will continue on ceftriaxone azithromycin check strep antigen, Legionella antigen, sputum culture and blood culture.  Elevated troponin: Suspecting myocarditis due to #1.  ACS less likely given young age, EKG had some T wave inversion inferolateral repeat  EKG serially, trend troponin and if significant rise amy need to reconsult cardiology-discussed w/ EDP.Cardiologist Dr Mariah Milling was consulted by EDP and they have recommended to obtain echocardiogram for now.  Positive D dimer: CT angio no PE, it was adequate study.  Check duplex of the legs.  This likely from pneumonia  Body mass index is 19.2 kg/m.   Severity of Illness: * I certify that at the point of admission it is my clinical judgment that the patient will require less than 2 midnight stay for ongoing management of pneumonia and for further work-up.    DVT prophylaxis: enoxaparin (LOVENOX) injection 40 mg Start: 09/02/20 1830 Code Status:   Code Status: Full Code  Family Communication: Admission, patients condition and plan of care including tests being ordered have been discussed with the patient who indicate understanding and agree with the plan and Code Status.  Consults called:  Cardiology by EDP  Lanae Boast MD [facility]  If 7PM-7AM, please contact night-coverage [email or web address]  09/02/2020, 6:23 PM

## 2020-09-03 ENCOUNTER — Observation Stay
Admit: 2020-09-03 | Discharge: 2020-09-03 | Disposition: A | Discharge status: Home / Self Care | Payer: Managed Care, Other (non HMO) | Attending: Internal Medicine | Admitting: Internal Medicine

## 2020-09-03 DIAGNOSIS — J189 Pneumonia, unspecified organism: Secondary | ICD-10-CM | POA: Diagnosis not present

## 2020-09-03 DIAGNOSIS — I400 Infective myocarditis: Secondary | ICD-10-CM

## 2020-09-03 LAB — COMPREHENSIVE METABOLIC PANEL
ALT: 15 U/L (ref 0–44)
AST: 21 U/L (ref 15–41)
Albumin: 2.9 g/dL — ABNORMAL LOW (ref 3.5–5.0)
Alkaline Phosphatase: 57 U/L (ref 38–126)
Anion gap: 8 (ref 5–15)
BUN: 8 mg/dL (ref 6–20)
CO2: 25 mmol/L (ref 22–32)
Calcium: 8.3 mg/dL — ABNORMAL LOW (ref 8.9–10.3)
Chloride: 106 mmol/L (ref 98–111)
Creatinine, Ser: 0.59 mg/dL (ref 0.44–1.00)
GFR, Estimated: >60 mL/min (ref >60)
Glucose, Bld: 95 mg/dL (ref 70–99)
Potassium: 3.6 mmol/L (ref 3.5–5.1)
Sodium: 139 mmol/L (ref 135–145)
Total Bilirubin: 0.3 mg/dL (ref 0.3–1.2)
Total Protein: 6.2 g/dL — ABNORMAL LOW (ref 6.5–8.1)

## 2020-09-03 LAB — CBC
HCT: 33.5 % — ABNORMAL LOW (ref 36.0–46.0)
Hemoglobin: 11.1 g/dL — ABNORMAL LOW (ref 12.0–15.0)
MCH: 28.2 pg (ref 26.0–34.0)
MCHC: 33.1 g/dL (ref 30.0–36.0)
MCV: 85.2 fL (ref 80.0–100.0)
Platelets: 303 10*3/uL (ref 150–400)
RBC: 3.93 MIL/uL (ref 3.87–5.11)
RDW: 12 % (ref 11.5–15.5)
WBC: 6.5 10*3/uL (ref 4.0–10.5)
nRBC: 0 % (ref 0.0–0.2)

## 2020-09-03 LAB — TROPONIN I (HIGH SENSITIVITY)
Troponin I (High Sensitivity): 1448 ng/L (ref <18)
Troponin I (High Sensitivity): 975 ng/L (ref <18)

## 2020-09-03 LAB — ECHOCARDIOGRAM COMPLETE
AR max vel: 2.03 cm2
AV Area VTI: 1.96 cm2
AV Area mean vel: 2.2 cm2
AV Mean grad: 5 mmHg
AV Peak grad: 9 mmHg
Ao pk vel: 1.5 m/s
Area-P 1/2: 5.58 cm2
Height: 62 in
S' Lateral: 2.74 cm
Weight: 1680 oz

## 2020-09-03 LAB — STREP PNEUMONIAE URINARY ANTIGEN: Strep Pneumo Urinary Antigen: NEGATIVE

## 2020-09-03 MED ORDER — AZITHROMYCIN 250 MG PO TABS
500.0000 mg | ORAL_TABLET | Freq: Every day | ORAL | Status: DC
  Administered 2020-09-03: 500 mg via ORAL
  Filled 2020-09-03: qty 2

## 2020-09-03 NOTE — Progress Notes (Signed)
*  PRELIMINARY RESULTS* Echocardiogram 2D Echocardiogram has been performed.  Jamie Melendez 09/03/2020, 9:06 AM

## 2020-09-03 NOTE — Consult Note (Signed)
Cardiology Consultation:   Patient ID: Jamie Melendez Woodrick; [id number]; 2000/11/09   Admit date: 09/02/2020 Date of Consult: 09/03/2020  Primary Care Provider: Rolanda Lundborgiehl, Lee H, MD Primary Cardiologist: Kirke CorinArida Primary Electrophysiologist:  None   Patient Profile:   Jamie Melendez Amara is a 20 y.o. female with history of prior AKI who is being seen today for the evaluation of elevated troponin at the request of Dr. Jonathon BellowsKc.  History of Present Illness:   Ms. Blane previously admitted and 06/2018 with acute renal failure shortly after suffering from the flu with her renal failure felt to be due to dehydration and use of NSAIDs.  Renal function improved with hydration and returned to normal.  She is a [employer] and was able to return to all of her exercises without limitation.  She was evaluated by cardiology in 12/2018 for elevated BP and palpitations without chest pain, shortness of breath, or syncope.  Outpatient cardiac monitoring at that time showed normal sinus rhythm with an average heart rate of 83 bpm with rare PVCs of which some correlated with patient triggered events.  Overall, no significant arrhythmias.  Echo in 01/2019 showed an EF of 60 to 65%, normal LV cavity size, no LVH, normal RV systolic function and ventricular cavity size, normal PASP, and no significant valvular abnormalities.  She was recently seen in the ED on 08/28/2020 with URI symptoms and diagnosed with a viral illness. She was Covid negative. Rapid strep negative.  Following this ER visit she did have an episode of chest pain that radiated to her bilateral arms followed by back pain.  In this setting she underwent further laboratory testing including a mono-spot on 08/31/2020 that was negative, and a D-dimer that was elevated at 0.76.  She returned on 5/5 after undergoing blood work at outside office with noted elevated D-dimer, on 5/4, with associated cough and shortness of breath.  It was noted she had a T-max of 104.  CTA of  the chest on 5/5 was negative for PE, though did show a right lower lobe pneumonia as well as reactive right hilar and subcarinal lymphadenopathy.  Lower extremity ultrasound was negative for DVT in the bilateral lower extremities.  Initial high-sensitivity troponin 1784 with a delta and peak troponin of 2230, currently downtrending.  EKG NSR, 82 bpm, inferolateral T wave inversion.  Repeat COVID negative.  Strep pneumo antigen negative.  Blood cultures, sputum culture, and Legionella testing pending.  CBC unrevealing outside of a mild anemia with hemoglobin 11.4.  Hypokalemia of 3.3 has improved to 3.6.  Hypoalbuminemia is noted with an albumin of 2.9.  Renal and liver function normal.  Afebrile.  Vitals stable.  She has been treated with azithromycin and Rocephin along with nebulizer per internal medicine.  Echo is pending.  Cardiology is asked to evaluate for elevated troponin.  Repeat EKG this morning continues to show sinus rhythm with nonspecific inferior ST-T changes with improvement in lateral ST-T changes.  Currently, she is without chest pain, dyspnea, palpitations, dizziness, presyncope, or syncope.  URI symptoms have improved.  No alcohol, tobacco, or illicit substance use.  She is without cardiac complaints.   History reviewed. No pertinent past medical history.  History reviewed. No pertinent surgical history.   Home Meds: Prior to Admission medications   Medication Sig Start Date End Date Taking? Authorizing Provider  Clindamycin-Benzoyl Per, Refr, gel 2 (two) times daily. 12/06/18  Yes [provider]  norethindrone-ethinyl estradiol-iron (ESTROSTEP FE,TILIA FE,TRI-LEGEST FE) 1-20/1-30/1-35 MG-MCG tablet Take 1  tablet by mouth daily.   Yes [provider]  tretinoin (RETIN-A) 0.1 % cream Apply 1 application topically at bedtime. 05/31/18  Yes [provider]    Inpatient Medications: Scheduled Meds: . albuterol  2.5 mg Nebulization Q6H  . enoxaparin (LOVENOX)  injection  40 mg Subcutaneous Q24H  . guaiFENesin  600 mg Oral BID   Continuous Infusions: . azithromycin    . cefTRIAXone (ROCEPHIN)  IV     PRN Meds: acetaminophen **OR** acetaminophen, ondansetron **OR** ondansetron (ZOFRAN) IV, senna-docusate  Allergies:   Allergies  Allergen Reactions  . Penicillins     unknown   . Amoxicillin Rash    Social History:   Social History   Socioeconomic History  . Marital status: Single    Spouse name: Not on file  . Number of children: Not on file  . Years of education: Not on file  . Highest education level: Not on file  Occupational History  . Not on file  Tobacco Use  . Smoking status: Never Smoker  . Smokeless tobacco: Never Used  Vaping Use  . Vaping Use: Never used  Substance and Sexual Activity  . Alcohol use: Yes    Comment: social  . Drug use: Never  . Sexual activity: Not on file  Other Topics Concern  . Not on file  Social History Narrative  . Not on file   Social Determinants of Health   Financial Resource Strain: Not on file  Food Insecurity: Not on file  Transportation Needs: Not on file  Physical Activity: Not on file  Stress: Not on file  Social Connections: Not on file  Intimate Partner Violence: Not on file     Family History:   Family History  Problem Relation Age of Onset  . Hypertension Father   . Diabetes Father   . Diabetes Paternal Grandfather     ROS:  Review of Systems  Constitutional: Positive for fever and malaise/fatigue. Negative for chills, diaphoresis and weight loss.  HENT: Positive for congestion.   Eyes: Negative for discharge and redness.  Respiratory: Negative for cough, sputum production, shortness of breath and wheezing.   Cardiovascular: Positive for chest pain. Negative for palpitations, orthopnea, claudication, leg swelling and PND.  Gastrointestinal: Negative for abdominal pain, heartburn, nausea and vomiting.  Musculoskeletal: Positive for back pain. Negative for  falls and myalgias.  Skin: Negative for rash.  Neurological: Negative for dizziness, tingling, tremors, sensory change, speech change, focal weakness, loss of consciousness and weakness.  Endo/Heme/Allergies: Does not bruise/bleed easily.  Psychiatric/Behavioral: Negative for substance abuse. The patient is not nervous/anxious.       Physical Exam/Data:   Vitals:   09/02/20 2017 09/02/20 2300 09/03/20 0315 09/03/20 0751  BP: 122/78 116/67 123/61   Pulse: 77 77 91   Resp: 16 16 16    Temp: 98.3 F (36.8 C) 98.1 F (36.7 C) 97.7 F (36.5 C)   TempSrc: Oral Oral Oral   SpO2: 100% 100% 100% 100%  Weight:      Height:        Intake/Output Summary (Last 24 hours) at 09/03/2020 1125 Last data filed at 09/03/2020 1050 Gross per 24 hour  Intake 240 ml  Output --  Net 240 ml   Filed Weights   09/02/20 1517  Weight: 47.6 kg   Body mass index is 19.2 kg/m.   Physical Exam: General: Well developed, well nourished, in no acute distress. Head: Normocephalic, atraumatic, sclera non-icteric, no xanthomas, nares without discharge.  Neck:  Negative for carotid bruits. JVD not elevated. Lungs: Clear bilaterally to auscultation without wheezes, rales, or rhonchi. Breathing is unlabored. Heart: RRR with S1 S2. No murmurs, rubs, or gallops appreciated. Abdomen: Soft, non-tender, non-distended with normoactive bowel sounds. No hepatomegaly. No rebound/guarding. No obvious abdominal masses. Msk:  Strength and tone appear normal for age. Extremities: No clubbing or cyanosis. No edema. Distal pedal pulses are 2+ and equal bilaterally. Neuro: Alert and oriented X 3. No facial asymmetry. No focal deficit. Moves all extremities spontaneously. Psych:  Responds to questions appropriately with a normal affect.   EKG:  The EKG was personally reviewed and demonstrates: NSR, 82 bpm, inferolateral T wave inversion Telemetry:  Telemetry was personally reviewed and demonstrates: SR with sinus tachycardia    Weights: Filed Weights   09/02/20 1517  Weight: 47.6 kg    Relevant CV Studies:  Zio patch 12/2018: Normal sinus rhythm with an average heart rate of 83 bpm. Rare PVCs overall some of which correlated with triggered events by the patient. No significant arrhythmia. __________  2D echo 01/2019: 1. Left ventricular ejection fraction, by visual estimation, is 60 to  65%. The left ventricle has normal function. Normal left ventricular size.  There is no left ventricular hypertrophy.  2. Global right ventricle has normal systolic function.The right  ventricular size is normal. No increase in right ventricular wall  thickness.  3. Left atrial size was normal.  4. Normal pulmonary artery systolic pressure.  Laboratory Data:  Chemistry Recent Labs  Lab 08/28/20 1933 09/02/20 1526 09/02/20 1852 09/03/20 0503  NA 135 137  --  139  K 3.4* 3.3*  --  3.6  CL 103 99  --  106  CO2 23 26  --  25  GLUCOSE 116* 94  --  95  BUN 12 12  --  8  CREATININE 0.85 0.62 0.60 0.59  CALCIUM 9.2 8.8*  --  8.3*  GFRNONAA >60 >60 >60 >60  ANIONGAP 9 12  --  8    Recent Labs  Lab 09/02/20 1526 09/03/20 0503  PROT 7.1 6.2*  ALBUMIN 3.5 2.9*  AST 29 21  ALT 18 15  ALKPHOS 70 57  BILITOT 0.5 0.3   Hematology Recent Labs  Lab 09/02/20 1526 09/02/20 1852 09/03/20 0503  WBC 5.5 6.0 6.5  RBC 4.40 4.03 3.93  HGB 12.3 11.4* 11.1*  HCT 36.9 33.9* 33.5*  MCV 83.9 84.1 85.2  MCH 28.0 28.3 28.2  MCHC 33.3 33.6 33.1  RDW 12.0 11.9 12.0  PLT 340 319 303   Cardiac EnzymesNo results for input(s): TROPONINI in the last 168 hours. No results for input(s): TROPIPOC in the last 168 hours.  BNPNo results for input(s): BNP, PROBNP in the last 168 hours.  DDimer No results for input(s): DDIMER in the last 168 hours.  Radiology/Studies:  CT Angio Chest PE W and/or Wo Contrast  Result Date: 09/02/2020 IMPRESSION: 1. Right lower lobe pneumonia. 2. Reactive right hilar and subcarinal  lymphadenopathy. 3. No evidence of pulmonary embolus. Electronically Signed   By: Sharlet Salina M.D.   On: 09/02/2020 17:08   US Venous Img Lower Bilateral (DVT)  Result Date: 09/03/2020 IMPRESSION: No evidence of deep venous thrombosis in either lower extremity. Electronically Signed   By: Irish Lack M.D.   On: 09/03/2020 08:05    Assessment and Plan:   1. Elevated troponin: -Currently, symptom-free  -HS-Tn peaked at 2230 and is currently down trending -EKG with nonspecific changes as outlined above -Echo  pending -Likely in the context of myocarditis with recent illness  -Further recommendations, including potential ischemic evaluation timing, pending echo -Exercise precautions discussed   2. RLL PNA: -Per IM   For questions or updates, please contact [facility] Please consult [email or web address] for contact info under Cardiology/STEMI.   Signed, Eula Listen, PA-C [facility] Pager: [phone] 09/03/2020, 11:25 AM

## 2020-09-03 NOTE — Progress Notes (Signed)
Mobility Specialist - Progress Note   09/03/20 1200  Mobility  Activity Ambulated in hall  Level of Assistance Independent  Assistive Device None  Distance Ambulated (ft) 500 ft  Mobility Response Tolerated well  Mobility performed by Mobility specialist  $Mobility charge 1 Mobility    Pre-mobility: 81 HR, 100% SpO2 During mobility: 103 HR, 99% SpO2 Post-mobility: 85 HR, 100% SpO2   Pt ambulated in hallway. Independent for transfers and ambulation. Denied SOB on RA. Does voice fatigue. No LOB.    Filiberto Pinks Mobility Specialist 09/03/20, 12:26 PM

## 2020-09-03 NOTE — Progress Notes (Signed)
PHARMACIST - PHYSICIAN COMMUNICATION DR:   Lanae Boast CONCERNING: Antibiotic IV to Oral Route Change Policy  RECOMMENDATION: This patient is receiving azithromycin by the intravenous route.  Based on criteria approved by the Pharmacy and Therapeutics Committee, the antibiotic(s) is/are being converted to the equivalent oral dose form(s).   DESCRIPTION: These criteria include:  Patient being treated for a respiratory tract infection, urinary tract infection, cellulitis or clostridium difficile associated diarrhea if on metronidazole  The patient is not neutropenic and does not exhibit a GI malabsorption state  The patient is eating (either orally or via tube) and/or has been taking other orally administered medications for a least 24 hours  The patient is improving clinically and has a Tmax < 100.5  If you have questions about this conversion, please contact the Pharmacy Department   Albina Billet, PharmD, BCPS Clinical Pharmacist 09/03/2020 11:45 AM

## 2020-09-03 NOTE — Progress Notes (Signed)
PROGRESS NOTE    Jamie Melendez  EAV:[id number] DOB: February 25, 2001 DOA: 09/02/2020 PCP: Rolanda Lundborgiehl, Lee H, MD   Chief Complaint  Patient presents with  . Shortness of Breath  Brief Narrative:  20 y.o. female with no significant medical history presents to the ED for evaluation of elevated D-dimer cough and shortness of breath.  Has been having upper respiratory symptoms since last Thursday, with fever up to 104 at home, cough, shortness of breath, was seen in the ED about 5 days ago and had negative work-up.Patient was seen again by her doctor 2 days ago and had additional lab work done including monotest,D-dimer that came back abnormal and referred to the ED for CT scan. Patient has had shortness of breath mostly with running but not with normal walking and had back pain last evening and chest pain 2 days ago.Last fever was 101.3 yesterday. Patient otherwise denies any nausea, vomiting, headache, focal weakness, numbness tingling, speech difficulties. She is not sleeping well, could not lay flat last night.No leg swelling however. currently has no complaints except for feeling tired.  ED Course: Vitals stable, afebrile, saturating well on room air.  Routine labs are stable but troponin was significantly elevated at 1700s, COVID-19 screen negative.  EKG was done personally reviewed shows normal sinus rhythm, inferolateral T wave changes.  Dr. Kennedy BuckerGrant from cardiology was discussed by cardiology and he advised echocardiogram to evaluate.  CT angio did not show any PE Showed right lower lobe pneumonia, reactive right hilar and subcarinal lymphadenopathy.  It was a technically adequate evaluation of the pulmonary vasculature.  Patient placed in [place] as admission and admission was requested.CTA "1. Right lower lobe pneumonia.2. Reactive right hilar and subcarinal lymphadenopathy. 3. No evidence of pulmonary embolus" Patient admitted managed with ceftriaxone azithromycin. She has remained afebrile no leukocytosis no  chest pain. Troponin peaked to 2230 and down trended 975  Subjective:  Afebrile overnight saturating well on room air. No chest pain slept well last night.  Assessment & Plan:  RLL pneumonia:  Strep pneumonia negative.overall stable not hypoxic no chest pain.  Blood culture pending.   Continue ceftriaxone and azithromycin.  Elevated troponin:  Unclear etiology question in the setting of pneumonia /?  Myocarditis,Troponin peaked to 2230 and down trended 975.  EKG nonspecific changes.  Cardio has been consulted for further recommendation.  Positive D dimer: CT angio no PE- it was adequate study.  No DVT on UD of leg  Diet Order            Diet 2 gram sodium Room service appropriate? Yes; Fluid consistency: Thin  Diet effective now               Patient's Body mass index is 19.2 kg/m.  DVT prophylaxis: enoxaparin (LOVENOX) injection 40 mg Start: 09/02/20 2200 Code Status:   Code Status: Full Code  Family Communication: plan of care discussed with patient at bedside.  Status is: admitted as observation Remains hospitalized for ongoing medical management further evaluation and consultation Dispo: The patient is from: Home              Anticipated d/c is to: Home              Patient currently is not medically stable to d/c.   Difficult to place patient No   Unresulted Labs (From admission, onward)          Start     Ordered   09/09/20 0500  Creatinine, serum  (enoxaparin (LOVENOX)  CrCl >/= 30 ml/min)  Weekly,   STAT     Comments: while on enoxaparin therapy    09/02/20 1823   09/02/20 1823  Legionella Pneumophila Serogp 1 Ur Ag  (COPD / Pneumonia )  Once,   STAT        09/02/20 1823   09/02/20 1823  Culture, sputum-assessment  (COPD / Pneumonia )  Once,   STAT        09/02/20 1823        Medications reviewed:  Scheduled Meds: . albuterol  2.5 mg Nebulization Q6H  . enoxaparin (LOVENOX) injection  40 mg Subcutaneous Q24H  . guaiFENesin  600 mg Oral BID    Continuous Infusions: . azithromycin    . cefTRIAXone (ROCEPHIN)  IV      Consultants: Cardiology   Procedures: 2D echocardiogram 1. Left ventricular ejection fraction, by estimation, is 65 to 70%. The  left ventricle has normal function. The left ventricle has no regional  wall motion abnormalities. Left ventricular diastolic parameters were  normal.  2. Right ventricular systolic function is normal. The right ventricular  size is mildly enlarged.  3. The mitral valve is grossly normal. Trivial mitral valve  regurgitation.  4. The aortic valve was not well visualized. Aortic valve regurgitation  is not visualized.   Antimicrobials: Anti-infectives (From admission, onward)   Start     Dose/Rate Route Frequency Ordered Stop   09/03/20 1800  cefTRIAXone (ROCEPHIN) 2 g in sodium chloride 0.9 % 100 mL IVPB        2 g 200 mL/hr over 30 Minutes Intravenous Every 24 hours 09/02/20 1823 09/08/20 1759   09/03/20 1800  azithromycin (ZITHROMAX) 500 mg in sodium chloride 0.9 % 250 mL IVPB        500 mg 250 mL/hr over 60 Minutes Intravenous Every 24 hours 09/02/20 1823 09/08/20 1759   09/02/20 1715  azithromycin (ZITHROMAX) 500 mg in sodium chloride 0.9 % 250 mL IVPB        500 mg 250 mL/hr over 60 Minutes Intravenous  Once 09/02/20 1708 09/02/20 1847   09/02/20 1715  cefTRIAXone (ROCEPHIN) 1 g in sodium chloride 0.9 % 100 mL IVPB        1 g 200 mL/hr over 30 Minutes Intravenous  Once 09/02/20 1708 09/02/20 1819     Culture/Microbiology    Component Value Date/Time   SDES BLOOD RIGHT FOREARM 09/02/2020 1705   SPECREQUEST  09/02/2020 1705    BOTTLES DRAWN AEROBIC AND ANAEROBIC Blood Culture results may not be optimal due to an excessive volume of blood received in culture bottles   CULT  09/02/2020 1705    NO GROWTH < 12 HOURS Performed at [facility], [address] [place], [place] [address]    REPTSTATUS PENDING 09/02/2020 1705    Other culture-see  note  Objective: Vitals: Today's Vitals   09/02/20 1900 09/02/20 2017 09/02/20 2300 09/03/20 0315  BP: (!) 128/93 122/78 116/67 123/61  Pulse: 85 77 77 91  Resp: Temp:  98.3 F (36.8 C) 98.1 F (36.7 C) 97.7 F (36.5 C)  TempSrc:  Oral Oral Oral  SpO2: 100% 100% 100% 100%  Weight:      Height:      PainSc:  0-No pain      Intake/Output Summary (Last 24 hours) at 09/03/2020 0725 Last data filed at 09/03/2020 6045 Gross per 24 hour  Intake 240 ml  Output --  Net 240 ml  Filed Weights   09/02/20 1517  Weight: 47.6 kg   Weight change:   Intake/Output from previous day: 05/05 0701 - 05/06 0700 In: 240 [P.O.:240] Out: -  Intake/Output this shift: No intake/output data recorded. Filed Weights   09/02/20 1517  Weight: 47.6 kg    Examination: General exam: AAO x3 , older than stated age, weak appearing. HEENT:Oral mucosa moist, Ear/Nose WNL grossly,dentition normal. Respiratory system: bilaterally diminished,no crackles, no use of accessory muscle, non tender. Cardiovascular system: S1 & S2 +,no murmur No JVD. Gastrointestinal system: Abdomen soft, NT,ND, BS+. Nervous System:Alert, awake, moving extremities Extremities: no edema, distal peripheral pulses palpable.  Skin: No rashes,no icterus. MSK: Normal muscle bulk,tone, power  Data Reviewed: I have personally reviewed following labs and imaging studies CBC: Recent Labs  Lab 08/28/20 1933 09/02/20 1526 09/02/20 1852 09/03/20 0503  WBC 9.2 5.5 6.0 6.5  NEUTROABS 7.0 2.9  --   --   HGB 13.9 12.3 11.4* 11.1*  HCT 41.9 36.9 33.9* 33.5*  MCV 85.3 83.9 84.1 85.2  PLT 319 340 319 303   Basic Metabolic Panel: Recent Labs  Lab 08/28/20 1933 09/02/20 1526 09/02/20 1852 09/03/20 0503  NA 135 137  --  139  K 3.4* 3.3*  --  3.6  CL 103 99  --  106  CO2 23 26  --  25  GLUCOSE 116* 94  --  95  BUN 12 12  --  8  CREATININE 0.85 0.62 0.60 0.59  CALCIUM 9.2 8.8*  --  8.3*   GFR: Estimated  Creatinine Clearance: 83.6 mL/min (by C-G formula based on SCr of 0.59 mg/dL). Liver Function Tests: Recent Labs  Lab 09/02/20 1526 09/03/20 0503  AST 29 21  ALT 18 15  ALKPHOS 70 57  BILITOT 0.5 0.3  PROT 7.1 6.2*  ALBUMIN 3.5 2.9*   No results for input(s): LIPASE, AMYLASE in the last 168 hours. No results for input(s): AMMONIA in the last 168 hours. Coagulation Profile: No results for input(s): INR, PROTIME in the last 168 hours. Cardiac Enzymes: No results for input(s): CKTOTAL, CKMB, CKMBINDEX, TROPONINI in the last 168 hours. BNP (last 3 results) No results for input(s): PROBNP in the last 8760 hours. HbA1C: No results for input(s): HGBA1C in the last 72 hours. CBG: No results for input(s): GLUCAP in the last 168 hours. Lipid Profile: No results for input(s): CHOL, HDL, LDLCALC, TRIG, CHOLHDL, LDLDIRECT in the last 72 hours. Thyroid Function Tests: No results for input(s): TSH, T4TOTAL, FREET4, T3FREE, THYROIDAB in the last 72 hours. Anemia Panel: No results for input(s): VITAMINB12, FOLATE, FERRITIN, TIBC, IRON, RETICCTPCT in the last 72 hours. Sepsis Labs: Recent Labs  Lab 08/28/20 1933  LATICACIDVEN 1.8    Recent Results (from the past 240 hour(s))  Group A Strep by PCR     Status: None   Collection Time: 08/28/20  7:33 PM   Specimen: Urine, Clean Catch; Sterile Swab  Result Value Ref Range Status   Group A Strep by PCR NOT DETECTED NOT DETECTED Final    Comment: Performed at [facility], [address] [place], [place] [address]  Resp Panel by RT-PCR (Flu A&B, Covid) Nasopharyngeal Swab     Status: None   Collection Time: 08/28/20  7:33 PM   Specimen: Nasopharyngeal Swab; Nasopharyngeal(NP) swabs in vial transport medium  Result Value Ref Range Status   SARS Coronavirus 2 by RT PCR NEGATIVE NEGATIVE Final    Comment: (NOTE) SARS-CoV-2 target nucleic acids are NOT  DETECTED.  The SARS-CoV-2 RNA is generally detectable in upper  respiratory specimens during the acute phase of infection. The lowest concentration of SARS-CoV-2 viral copies this assay can detect is 138 copies/mL. A negative result does not preclude SARS-Cov-2 infection and should not be used as the sole basis for treatment or other patient management decisions. A negative result may occur with  improper specimen collection/handling, submission of specimen other than nasopharyngeal swab, presence of viral mutation(s) within the areas targeted by this assay, and inadequate number of viral copies(<138 copies/mL). A negative result must be combined with clinical observations, patient history, and epidemiological information. The expected result is Negative.  Fact Sheet for Patients:  BloggerCourse.com  Fact Sheet for Healthcare Providers:  SeriousBroker.it  This test is no t yet approved or cleared by the [place] FDA and  has been authorized for detection and/or diagnosis of SARS-CoV-2 by FDA under an Emergency Use Authorization (EUA). This EUA will remain  in effect (meaning this test can be used) for the duration of the COVID-19 declaration under Section 564(b)(1) of the Act, 21 U.S.C.section 360bbb-3(b)(1), unless the authorization is terminated  or revoked sooner.       Influenza A by PCR NEGATIVE NEGATIVE Final   Influenza B by PCR NEGATIVE NEGATIVE Final    Comment: (NOTE) The Xpert Xpress SARS-CoV-2/FLU/RSV plus assay is intended as an aid in the diagnosis of influenza from Nasopharyngeal swab specimens and should not be used as a sole basis for treatment. Nasal washings and aspirates are unacceptable for Xpert Xpress SARS-CoV-2/FLU/RSV testing.  Fact Sheet for Patients: BloggerCourse.com  Fact Sheet for Healthcare Providers: SeriousBroker.it  This test is not yet approved or cleared by the [place] FDA and has been  authorized for detection and/or diagnosis of SARS-CoV-2 by FDA under an Emergency Use Authorization (EUA). This EUA will remain in effect (meaning this test can be used) for the duration of the COVID-19 declaration under Section 564(b)(1) of the Act, 21 U.S.C. section 360bbb-3(b)(1), unless the authorization is terminated or revoked.  Performed at [facility], [address] [place], [place] [address]   Resp Panel by RT-PCR (Flu A&B, Covid) Nasopharyngeal Swab     Status: None   Collection Time: 09/02/20  5:00 PM   Specimen: Nasopharyngeal Swab; Nasopharyngeal(NP) swabs in vial transport medium  Result Value Ref Range Status   SARS Coronavirus 2 by RT PCR NEGATIVE NEGATIVE Final    Comment: (NOTE) SARS-CoV-2 target nucleic acids are NOT DETECTED.  The SARS-CoV-2 RNA is generally detectable in upper respiratory specimens during the acute phase of infection. The lowest concentration of SARS-CoV-2 viral copies this assay can detect is 138 copies/mL. A negative result does not preclude SARS-Cov-2 infection and should not be used as the sole basis for treatment or other patient management decisions. A negative result may occur with  improper specimen collection/handling, submission of specimen other than nasopharyngeal swab, presence of viral mutation(s) within the areas targeted by this assay, and inadequate number of viral copies(<138 copies/mL). A negative result must be combined with clinical observations, patient history, and epidemiological information. The expected result is Negative.  Fact Sheet for Patients:  BloggerCourse.com  Fact Sheet for Healthcare Providers:  SeriousBroker.it  This test is no t yet approved or cleared by the [place] FDA and  has been authorized for detection and/or diagnosis of SARS-CoV-2 by FDA under an Emergency Use Authorization (EUA). This EUA will remain  in effect (meaning this  test can be used) for  the duration of the COVID-19 declaration under Section 564(b)(1) of the Act, 21 U.S.C.section 360bbb-3(b)(1), unless the authorization is terminated  or revoked sooner.       Influenza A by PCR NEGATIVE NEGATIVE Final   Influenza B by PCR NEGATIVE NEGATIVE Final    Comment: (NOTE) The Xpert Xpress SARS-CoV-2/FLU/RSV plus assay is intended as an aid in the diagnosis of influenza from Nasopharyngeal swab specimens and should not be used as a sole basis for treatment. Nasal washings and aspirates are unacceptable for Xpert Xpress SARS-CoV-2/FLU/RSV testing.  Fact Sheet for Patients: BloggerCourse.com  Fact Sheet for Healthcare Providers: SeriousBroker.it  This test is not yet approved or cleared by the [place] FDA and has been authorized for detection and/or diagnosis of SARS-CoV-2 by FDA under an Emergency Use Authorization (EUA). This EUA will remain in effect (meaning this test can be used) for the duration of the COVID-19 declaration under Section 564(b)(1) of the Act, 21 U.S.C. section 360bbb-3(b)(1), unless the authorization is terminated or revoked.  Performed at [facility], [address] [place], [place] [address]   Blood culture (routine x 2)     Status: None (Preliminary result)   Collection Time: 09/02/20  5:00 PM   Specimen: BLOOD LEFT FOREARM  Result Value Ref Range Status   Specimen Description BLOOD LEFT FOREARM  Final   Special Requests   Final    BOTTLES DRAWN AEROBIC AND ANAEROBIC Blood Culture results may not be optimal due to an excessive volume of blood received in culture bottles   Culture   Final    NO GROWTH < 12 HOURS Performed at [facility], [address], [place], [place] [address]    Report Status PENDING  Incomplete  Blood culture (routine x 2)     Status: None (Preliminary result)   Collection Time: 09/02/20  5:05 PM   Specimen: BLOOD RIGHT  FOREARM  Result Value Ref Range Status   Specimen Description BLOOD RIGHT FOREARM  Final   Special Requests   Final    BOTTLES DRAWN AEROBIC AND ANAEROBIC Blood Culture results may not be optimal due to an excessive volume of blood received in culture bottles   Culture   Final    NO GROWTH < 12 HOURS Performed at [facility], [address], [place], [place] [address]    Report Status PENDING  Incomplete     Radiology Studies: CT Angio Chest PE W and/or Wo Contrast  Result Date: 09/02/2020 CLINICAL DATA:  Short of breath, elevated D-dimer, back pain EXAM: CT ANGIOGRAPHY CHEST WITH CONTRAST TECHNIQUE: Multidetector CT imaging of the chest was performed using the standard protocol during bolus administration of intravenous contrast. Multiplanar CT image reconstructions and MIPs were obtained to evaluate the vascular anatomy. CONTRAST:  82mL OMNIPAQUE IOHEXOL 350 MG/ML SOLN COMPARISON:  08/28/2020 FINDINGS: Cardiovascular: This is a technically adequate evaluation of the pulmonary vasculature. No filling defects or pulmonary emboli. The heart is unremarkable without pericardial effusion. No evidence of thoracic aortic aneurysm or dissection. Mediastinum/Nodes: Right hilar and subcarinal adenopathy likely reactive. Largest lymph node measuring 10 mm in short axis. Thyroid, trachea, and esophagus are unremarkable. Lungs/Pleura: There is dense right lower lobe airspace disease involving the right posterior costophrenic angle, compatible with pneumonia. No effusion or pneumothorax. Central airways are patent. Upper Abdomen: No acute abnormality. Musculoskeletal: No acute or destructive bony lesions. Reconstructed images demonstrate no additional findings. Review of the MIP images confirms the above findings. IMPRESSION: 1. Right lower lobe  pneumonia. 2. Reactive right hilar and subcarinal lymphadenopathy. 3. No evidence of pulmonary embolus. Electronically Signed   By: Sharlet Salina M.D.   On:  09/02/2020 17:08     LOS: 0 days   Lanae Boast, MD [facility]  09/03/2020, 7:25 AM

## 2020-09-03 NOTE — Plan of Care (Signed)
Continuing with plan of care. 

## 2020-09-04 DIAGNOSIS — J189 Pneumonia, unspecified organism: Secondary | ICD-10-CM | POA: Diagnosis not present

## 2020-09-04 DIAGNOSIS — R778 Other specified abnormalities of plasma proteins: Secondary | ICD-10-CM

## 2020-09-04 MED ORDER — ALBUTEROL SULFATE (2.5 MG/3ML) 0.083% IN NEBU
2.5000 mg | INHALATION_SOLUTION | Freq: Three times a day (TID) | RESPIRATORY_TRACT | Status: DC
  Administered 2020-09-04: 2.5 mg via RESPIRATORY_TRACT
  Filled 2020-09-04: qty 3

## 2020-09-04 MED ORDER — CEPHALEXIN 500 MG PO CAPS: 500.0000 mg | ORAL_CAPSULE | Freq: Four times a day (QID) | ORAL | 0 refills | Status: AC

## 2020-09-04 MED ORDER — AZITHROMYCIN 500 MG PO TABS: 500.0000 mg | ORAL_TABLET | Freq: Every day | ORAL | 0 refills | Status: AC

## 2020-09-04 MED ORDER — ALBUTEROL SULFATE (2.5 MG/3ML) 0.083% IN NEBU: 2.5000 mg | INHALATION_SOLUTION | Freq: Two times a day (BID) | RESPIRATORY_TRACT | Status: DC

## 2020-09-04 NOTE — Progress Notes (Addendum)
Progress Note  Patient Name: Jamie Melendez Date of Encounter: 09/04/2020  Primary Cardiologist: Kirke Corin  Subjective   No chest pain or dyspnea. Echo showed preserved LVSF, no RWMA, normal RVSF with mildly enlarged RV cavity size, trivial MR, and no evidence of pericardial effusion.   Inpatient Medications    Scheduled Meds: . albuterol  2.5 mg Nebulization TID  . azithromycin  500 mg Oral Daily  . enoxaparin (LOVENOX) injection  40 mg Subcutaneous Q24H  . guaiFENesin  600 mg Oral BID   Continuous Infusions: . cefTRIAXone (ROCEPHIN)  IV Stopped (09/03/20 1820)   PRN Meds: acetaminophen **OR** acetaminophen, ondansetron **OR** ondansetron (ZOFRAN) IV, senna-docusate   Vital Signs    Vitals:   09/03/20 1338 09/03/20 1939 09/03/20 2304 09/04/20 0544  BP: 122/70 122/81 100/65 120/73  Pulse: 88 89 80 91  Resp: 14  15 16   Temp: 98.2 F (36.8 C) 98.3 F (36.8 C) 98.1 F (36.7 C) 97.8 F (36.6 C)  TempSrc: Oral     SpO2: 99% 100% 98% 100%  Weight:      Height:        Intake/Output Summary (Last 24 hours) at 09/04/2020 0811 Last data filed at 09/04/2020 0308 Gross per 24 hour  Intake 100 ml  Output --  Net 100 ml   Filed Weights   09/02/20 1517  Weight: 47.6 kg    Telemetry    Sinus rhythm with episodes of sinus tachycardia - Personally Reviewed  ECG    No new tracings - Personally Reviewed  Physical Exam   GEN: No acute distress.   Neck: No JVD. Cardiac: RRR, no murmurs, rubs, or gallops.  Respiratory: Clear to auscultation bilaterally.  GI: Soft, nontender, non-distended.   MS: No edema; No deformity. Neuro:  Alert and oriented x 3; Nonfocal.  Psych: Normal affect.  Labs    Chemistry Recent Labs  Lab 08/28/20 1933 09/02/20 1526 09/02/20 1852 09/03/20 0503  NA 135 137  --  139  K 3.4* 3.3*  --  3.6  CL 103 99  --  106  CO2 23 26  --  25  GLUCOSE 116* 94  --  95  BUN 12 12  --  8  CREATININE 0.85 0.62 0.60 0.59  CALCIUM 9.2 8.8*  --  8.3*   PROT  --  7.1  --  6.2*  ALBUMIN  --  3.5  --  2.9*  AST  --  29  --  21  ALT  --  18  --  15  ALKPHOS  --  70  --  57  BILITOT  --  0.5  --  0.3  GFRNONAA >60 >60 >60 >60  ANIONGAP 9 12  --  8     Hematology Recent Labs  Lab 09/02/20 1526 09/02/20 1852 09/03/20 0503  WBC 5.5 6.0 6.5  RBC 4.40 4.03 3.93  HGB 12.3 11.4* 11.1*  HCT 36.9 33.9* 33.5*  MCV 83.9 84.1 85.2  MCH 28.0 28.3 28.2  MCHC 33.3 33.6 33.1  RDW 12.0 11.9 12.0  PLT 340 319 303    Cardiac EnzymesNo results for input(s): TROPONINI in the last 168 hours. No results for input(s): TROPIPOC in the last 168 hours.   BNPNo results for input(s): BNP, PROBNP in the last 168 hours.   DDimer No results for input(s): DDIMER in the last 168 hours.   Radiology    CT Angio Chest PE W and/or Wo Contrast  Result Date: 09/02/2020 IMPRESSION:  1. Right lower lobe pneumonia. 2. Reactive right hilar and subcarinal lymphadenopathy. 3. No evidence of pulmonary embolus. Electronically Signed   By: Sharlet Salina M.D.   On: 09/02/2020 17:08   US Venous Img Lower Bilateral (DVT)  Result Date: 09/03/2020 IMPRESSION: No evidence of deep venous thrombosis in either lower extremity. Electronically Signed   By: Irish Lack M.D.   On: 09/03/2020 08:05   Cardiac Studies   Zio patch 12/2018: Normal sinus rhythm with an average heart rate of 83 bpm. Rare PVCs overall some of which correlated with triggered events by the patient. No significant arrhythmia. __________  2D echo 01/2019: 1. Left ventricular ejection fraction, by visual estimation, is 60 to  65%. The left ventricle has normal function. Normal left ventricular size.  There is no left ventricular hypertrophy.  2. Global right ventricle has normal systolic function.The right  ventricular size is normal. No increase in right ventricular wall  thickness.  3. Left atrial size was normal.  4. Normal pulmonary artery systolic pressure. __________  2D echo  09/03/2020: 1. Left ventricular ejection fraction, by estimation, is 65 to 70%. The  left ventricle has normal function. The left ventricle has no regional  wall motion abnormalities. Left ventricular diastolic parameters were  normal.  2. Right ventricular systolic function is normal. The right ventricular  size is mildly enlarged.  3. The mitral valve is grossly normal. Trivial mitral valve  regurgitation.  4. The aortic valve was not well visualized. Aortic valve regurgitation  is not visualized.  Patient Profile     20 y.o. female with history of prior AKI who is being seen today for the evaluation of elevated troponin at the request of Dr. Jonathon Bellows.  Assessment & Plan    1. Elevated troponin: -Currently, symptom-free  -HS-Tn peaked at 2230 and is down trending -Likely in the setting of acute myocarditis with recent viral illness with symptoms being mild overall -EKG without significant changes and echo with preserved LVSF and no evidence of pericardial effusion  -Exercise precautions discussed   2. RLL PNA: -Per IM  Ok for discharge from our perspective once she has been seen by Dr. Elease Hashimoto.   For questions or updates, please contact [facility] Please consult [email or web address] for contact info under Cardiology/STEMI.    Signed, Eula Listen, PA-C [facility] Pager: [phone] 09/04/2020, 8:11 AM    Attending note:  Pt was discharged before I was able to see her .      Kristeen Miss, MD  09/04/2020 11:16 AM    [facility] [address] [place],  [address] [place], [place]  [address] Phone: [phone]; Fax: [phone]

## 2020-09-04 NOTE — Discharge Summary (Signed)
Physician Discharge Summary  Kaede Clendenen VHQ:[id number] DOB: December 23, 2000 DOA: 09/02/2020  PCP: Rolanda Lundborg, MD  Admit date: 09/02/2020 Discharge date: 09/04/2020  Admitted From: home Disposition:  home  Recommendations for Outpatient Follow-up:  1. Follow up with PCP, and with cardiology- call in 1-2 weeks  Home Health:no  Equipment/Devices: none  Discharge Condition: Stable Code Status:   Code Status: Full Code Diet recommendation:  Diet Order            Diet general           Diet 2 gram sodium Room service appropriate? Yes; Fluid consistency: Thin  Diet effective now                  Brief/Interim Summary: 20 y.o.femalewithno significantmedical historypresents to the ED for evaluation of elevated D-dimer cough and shortness of breath. Has been having upper respiratory symptoms since last Thursday,with fever up to 104 at home, cough, shortness of breath, was seen in the ED about5 days ago and had negative work-up.Patient was seen again by her doctor 2 days ago and had additional lab work done including monotest,D-dimer that came back abnormal and referred to the ED for CT scan. Patient has had shortness of breath mostly with running but not with normal walking and had back pain last evening and chest pain 2 days ago.Last fever was 101.3 yesterday.Patient otherwise denies any nausea, vomiting, headache, focal weakness, numbness tingling, speech difficulties. She is not sleeping well, could not lay flat last night.No leg swelling however. currently has no complaints except for feeling tired.  ED Course:Vitals stable, afebrile,saturating well on room air.Routine labs are stable but troponin was significantly elevated at 1700s,COVID-19 screen negative. EKG wasdone personally reviewed shows normal sinus rhythm,inferolateral T wave changes.Dr. Kennedy Bucker from cardiology was discussed by cardiology and he advised echocardiogram to evaluate. CT angio did not show any PE Showed  right lower lobe pneumonia,reactive right hilar and subcarinal lymphadenopathy.It was a technically adequate evaluation of the pulmonaryvasculature.Patient placed in [place] as admission and admission was requested.CTA "1. Right lower lobe pneumonia.2. Reactive right hilar and subcarinal lymphadenopathy. 3. No evidence of pulmonary embolus" Patient admitted managed with ceftriaxone azithromycin. She has remained afebrile no leukocytosis no chest pain. Troponin peaked to 2230 and down trended 975. Patient was managed with IV antibiotics as above.  Seen by cardiology suspecting mild myocarditis, echo was fairly normal, x-ray result was discussed with cardiology okay for discharge home.  Discharge Diagnoses:  RLL pneumonia:Clinically improved treated with IV antibiotics, will discharge her home on oral antibiotics.  She will follow-up with PCP we will repeat imaging in 4 weeks.  Elevated troponin:Suspecting mild myocarditis in the setting of #1.  Echocardiogram with normal EF no acute finding seen by cardiology no further recommendation.  Exercise  precaution has been discussed by them and they will follow-up as outpatient.   Positive D dimer:CT angio no PE- it was adequate study.  No DVT on UD of leg  Consults:  cardiology  Subjective: Alert awake oriented resting comfortably no flank pain no chest pain no fever.  Mother is at the bedside.  Eager to go home today.  Discharge Exam: Vitals:   09/04/20 0544 09/04/20 0946  BP: 120/73 115/64  Pulse: 91 81  Resp: 16 18  Temp: 97.8 F (36.6 C) 97.8 F (36.6 C)  SpO2: 100% 100%   General: Pt is alert, awake, not in acute distress Cardiovascular: RRR, S1/S2 +, no rubs, no gallops Respiratory: CTA bilaterally, no wheezing, no  rhonchi Abdominal: Soft, NT, ND, bowel sounds + Extremities: no edema, no cyanosis  Discharge Instructions  Discharge Instructions    Diet general   Complete by: As directed    Discharge instructions    Complete by: As directed    Please call call MD or return to ER for similar or worsening recurring problem that brought you to hospital or if any fever,nausea/vomiting,abdominal pain, uncontrolled pain, chest pain,  shortness of breath or any other alarming symptoms.  Please follow-up your doctor as instructed in a week time and call the office for appointment.  Please avoid alcohol, smoking, or any other illicit substance and maintain healthy habits including taking your regular medications as prescribed.  You were cared for by a hospitalist during your hospital stay. If you have any questions about your discharge medications or the care you received while you were in the hospital after you are discharged, you can call the unit and ask to speak with the hospitalist on call if the hospitalist that took care of you is not available.  Once you are discharged, your primary care physician will handle any further medical issues. Please note that NO REFILLS for any discharge medications will be authorized once you are discharged, as it is imperative that you return to your primary care physician (or establish a relationship with a primary care physician if you do not have one) for your aftercare needs so that they can reassess your need for medications and monitor your lab values   Increase activity slowly   Complete by: As directed      Allergies as of 09/04/2020      Reactions   Penicillins    unknown   Amoxicillin Rash      Medication List    TAKE these medications   azithromycin 500 MG tablet Commonly known as: ZITHROMAX Take 1 tablet (500 mg total) by mouth daily for 3 days.   cephALEXin 500 MG capsule Commonly known as: KEFLEX Take 1 capsule (500 mg total) by mouth 4 (four) times daily for 7 days.   Clindamycin-Benzoyl Per (Refr) gel 2 (two) times daily.   norethindrone-ethinyl estradiol-iron 1-20/1-30/1-35 MG-MCG tablet Commonly known as: ESTROSTEP FE Take 1 tablet by mouth  daily.   tretinoin 0.1 % cream Commonly known as: RETIN-A Apply 1 application topically at bedtime.       Allergies  Allergen Reactions  . Penicillins     unknown   . Amoxicillin Rash    The results of significant diagnostics from this hospitalization (including imaging, microbiology, ancillary and laboratory) are listed below for reference.    Microbiology: Recent Results (from the past 240 hour(s))  Group A Strep by PCR     Status: None   Collection Time: 08/28/20  7:33 PM   Specimen: Urine, Clean Catch; Sterile Swab  Result Value Ref Range Status   Group A Strep by PCR NOT DETECTED NOT DETECTED Final    Comment: Performed at [facility], [address], [place], [place] [address]  Resp Panel by RT-PCR (Flu A&B, Covid) Nasopharyngeal Swab     Status: None   Collection Time: 08/28/20  7:33 PM   Specimen: Nasopharyngeal Swab; Nasopharyngeal(NP) swabs in vial transport medium  Result Value Ref Range Status   SARS Coronavirus 2 by RT PCR NEGATIVE NEGATIVE Final    Comment: (NOTE) SARS-CoV-2 target nucleic acids are NOT DETECTED.  The SARS-CoV-2 RNA is generally detectable in upper respiratory specimens during the acute phase of infection. The lowest  concentration of SARS-CoV-2 viral copies this assay can detect is 138 copies/mL. A negative result does not preclude SARS-Cov-2 infection and should not be used as the sole basis for treatment or other patient management decisions. A negative result may occur with  improper specimen collection/handling, submission of specimen other than nasopharyngeal swab, presence of viral mutation(s) within the areas targeted by this assay, and inadequate number of viral copies(<138 copies/mL). A negative result must be combined with clinical observations, patient history, and epidemiological information. The expected result is Negative.  Fact Sheet for Patients:  BloggerCourse.com  Fact Sheet for  Healthcare Providers:  SeriousBroker.it  This test is no t yet approved or cleared by the [place] FDA and  has been authorized for detection and/or diagnosis of SARS-CoV-2 by FDA under an Emergency Use Authorization (EUA). This EUA will remain  in effect (meaning this test can be used) for the duration of the COVID-19 declaration under Section 564(b)(1) of the Act, 21 U.S.C.section 360bbb-3(b)(1), unless the authorization is terminated  or revoked sooner.       Influenza A by PCR NEGATIVE NEGATIVE Final   Influenza B by PCR NEGATIVE NEGATIVE Final    Comment: (NOTE) The Xpert Xpress SARS-CoV-2/FLU/RSV plus assay is intended as an aid in the diagnosis of influenza from Nasopharyngeal swab specimens and should not be used as a sole basis for treatment. Nasal washings and aspirates are unacceptable for Xpert Xpress SARS-CoV-2/FLU/RSV testing.  Fact Sheet for Patients: BloggerCourse.com  Fact Sheet for Healthcare Providers: SeriousBroker.it  This test is not yet approved or cleared by the [place] FDA and has been authorized for detection and/or diagnosis of SARS-CoV-2 by FDA under an Emergency Use Authorization (EUA). This EUA will remain in effect (meaning this test can be used) for the duration of the COVID-19 declaration under Section 564(b)(1) of the Act, 21 U.S.C. section 360bbb-3(b)(1), unless the authorization is terminated or revoked.  Performed at [facility], [address] [place], [place] [address]   Resp Panel by RT-PCR (Flu A&B, Covid) Nasopharyngeal Swab     Status: None   Collection Time: 09/02/20  5:00 PM   Specimen: Nasopharyngeal Swab; Nasopharyngeal(NP) swabs in vial transport medium  Result Value Ref Range Status   SARS Coronavirus 2 by RT PCR NEGATIVE NEGATIVE Final    Comment: (NOTE) SARS-CoV-2 target nucleic acids are NOT DETECTED.  The SARS-CoV-2 RNA is  generally detectable in upper respiratory specimens during the acute phase of infection. The lowest concentration of SARS-CoV-2 viral copies this assay can detect is 138 copies/mL. A negative result does not preclude SARS-Cov-2 infection and should not be used as the sole basis for treatment or other patient management decisions. A negative result may occur with  improper specimen collection/handling, submission of specimen other than nasopharyngeal swab, presence of viral mutation(s) within the areas targeted by this assay, and inadequate number of viral copies(<138 copies/mL). A negative result must be combined with clinical observations, patient history, and epidemiological information. The expected result is Negative.  Fact Sheet for Patients:  BloggerCourse.com  Fact Sheet for Healthcare Providers:  SeriousBroker.it  This test is no t yet approved or cleared by the [place] FDA and  has been authorized for detection and/or diagnosis of SARS-CoV-2 by FDA under an Emergency Use Authorization (EUA). This EUA will remain  in effect (meaning this test can be used) for the duration of the COVID-19 declaration under Section 564(b)(1) of the Act, 21 U.S.C.section 360bbb-3(b)(1), unless the authorization is terminated  or revoked sooner.       Influenza A by PCR NEGATIVE NEGATIVE Final   Influenza B by PCR NEGATIVE NEGATIVE Final    Comment: (NOTE) The Xpert Xpress SARS-CoV-2/FLU/RSV plus assay is intended as an aid in the diagnosis of influenza from Nasopharyngeal swab specimens and should not be used as a sole basis for treatment. Nasal washings and aspirates are unacceptable for Xpert Xpress SARS-CoV-2/FLU/RSV testing.  Fact Sheet for Patients: BloggerCourse.com  Fact Sheet for Healthcare Providers: SeriousBroker.it  This test is not yet approved or cleared by the [place] FDA and has been authorized for detection and/or diagnosis of SARS-CoV-2 by FDA under an Emergency Use Authorization (EUA). This EUA will remain in effect (meaning this test can be used) for the duration of the COVID-19 declaration under Section 564(b)(1) of the Act, 21 U.S.C. section 360bbb-3(b)(1), unless the authorization is terminated or revoked.  Performed at [facility], [address] [place], [place] [address]   Blood culture (routine x 2)     Status: None (Preliminary result)   Collection Time: 09/02/20  5:00 PM   Specimen: BLOOD LEFT FOREARM  Result Value Ref Range Status   Specimen Description BLOOD LEFT FOREARM  Final   Special Requests   Final    BOTTLES DRAWN AEROBIC AND ANAEROBIC Blood Culture results may not be optimal due to an excessive volume of blood received in culture bottles   Culture   Final    NO GROWTH 2 DAYS Performed at [facility], [address], [place], [place] [address]    Report Status PENDING  Incomplete  Blood culture (routine x 2)     Status: None (Preliminary result)   Collection Time: 09/02/20  5:05 PM   Specimen: BLOOD RIGHT FOREARM  Result Value Ref Range Status   Specimen Description BLOOD RIGHT FOREARM  Final   Special Requests   Final    BOTTLES DRAWN AEROBIC AND ANAEROBIC Blood Culture results may not be optimal due to an excessive volume of blood received in culture bottles   Culture   Final    NO GROWTH 2 DAYS Performed at [facility], [address] [place], [place] [address]    Report Status PENDING  Incomplete    Procedures/Studies: DG Chest 2 View  Result Date: 08/28/2020 CLINICAL DATA:  Fever, upper respiratory infection EXAM: CHEST - 2 VIEW COMPARISON:  None. FINDINGS: Frontal and lateral views of the chest demonstrate an unremarkable cardiac silhouette. No acute airspace disease, effusion, or pneumothorax. No acute bony abnormalities. IMPRESSION: 1. No acute intrathoracic process.  Electronically Signed   By: Sharlet Salina M.D.   On: 08/28/2020 22:09   CT Angio Chest PE W and/or Wo Contrast  Result Date: 09/02/2020 CLINICAL DATA:  Short of breath, elevated D-dimer, back pain EXAM: CT ANGIOGRAPHY CHEST WITH CONTRAST TECHNIQUE: Multidetector CT imaging of the chest was performed using the standard protocol during bolus administration of intravenous contrast. Multiplanar CT image reconstructions and MIPs were obtained to evaluate the vascular anatomy. CONTRAST:  75mL OMNIPAQUE IOHEXOL 350 MG/ML SOLN COMPARISON:  08/28/2020 FINDINGS: Cardiovascular: This is a technically adequate evaluation of the pulmonary vasculature. No filling defects or pulmonary emboli. The heart is unremarkable without pericardial effusion. No evidence of thoracic aortic aneurysm or dissection. Mediastinum/Nodes: Right hilar and subcarinal adenopathy likely reactive. Largest lymph node measuring 10 mm in short axis. Thyroid, trachea, and esophagus are unremarkable. Lungs/Pleura: There is dense right lower lobe airspace disease involving the right posterior  costophrenic angle, compatible with pneumonia. No effusion or pneumothorax. Central airways are patent. Upper Abdomen: No acute abnormality. Musculoskeletal: No acute or destructive bony lesions. Reconstructed images demonstrate no additional findings. Review of the MIP images confirms the above findings. IMPRESSION: 1. Right lower lobe pneumonia. 2. Reactive right hilar and subcarinal lymphadenopathy. 3. No evidence of pulmonary embolus. Electronically Signed   By: Sharlet Salina M.D.   On: 09/02/2020 17:08   US Venous Img Lower Bilateral (DVT)  Result Date: 09/03/2020 CLINICAL DATA:  Elevated D-dimer and shortness of breath. No evidence of pulmonary embolism by CTA. EXAM: BILATERAL LOWER EXTREMITY VENOUS DOPPLER ULTRASOUND TECHNIQUE: Gray-scale sonography with graded compression, as well as color Doppler and duplex ultrasound were performed to evaluate the lower  extremity deep venous systems from the level of the common femoral vein and including the common femoral, femoral, profunda femoral, popliteal and calf veins including the posterior tibial, peroneal and gastrocnemius veins when visible. The superficial great saphenous vein was also interrogated. Spectral Doppler was utilized to evaluate flow at rest and with distal augmentation maneuvers in the common femoral, femoral and popliteal veins. COMPARISON:  None. FINDINGS: RIGHT LOWER EXTREMITY Common Femoral Vein: No evidence of thrombus. Normal compressibility, respiratory phasicity and response to augmentation. Saphenofemoral Junction: No evidence of thrombus. Normal compressibility and flow on color Doppler imaging. Profunda Femoral Vein: No evidence of thrombus. Normal compressibility and flow on color Doppler imaging. Femoral Vein: No evidence of thrombus. Normal compressibility, respiratory phasicity and response to augmentation. Popliteal Vein: No evidence of thrombus. Normal compressibility, respiratory phasicity and response to augmentation. Calf Veins: No evidence of thrombus. Normal compressibility and flow on color Doppler imaging. Superficial Great Saphenous Vein: No evidence of thrombus. Normal compressibility. Venous Reflux:  None. Other Findings: No evidence of superficial thrombophlebitis or abnormal fluid collection. LEFT LOWER EXTREMITY Common Femoral Vein: No evidence of thrombus. Normal compressibility, respiratory phasicity and response to augmentation. Saphenofemoral Junction: No evidence of thrombus. Normal compressibility and flow on color Doppler imaging. Profunda Femoral Vein: No evidence of thrombus. Normal compressibility and flow on color Doppler imaging. Femoral Vein: No evidence of thrombus. Normal compressibility, respiratory phasicity and response to augmentation. Popliteal Vein: No evidence of thrombus. Normal compressibility, respiratory phasicity and response to augmentation. Calf  Veins: No evidence of thrombus. Normal compressibility and flow on color Doppler imaging. Superficial Great Saphenous Vein: No evidence of thrombus. Normal compressibility. Venous Reflux:  None. Other Findings: No evidence of superficial thrombophlebitis or abnormal fluid collection. IMPRESSION: No evidence of deep venous thrombosis in either lower extremity. Electronically Signed   By: Irish Lack M.D.   On: 09/03/2020 08:05   ECHOCARDIOGRAM COMPLETE  Result Date: 09/03/2020    ECHOCARDIOGRAM REPORT   Patient Name:   Jamie Melendez Date of Exam: 09/03/2020 Medical Rec #:  [id number]      Height:       62.0 in Accession #:    [id number]     Weight:       105.0 lb Date of Birth:  13-Jul-2000      BSA:          1.454 m Patient Age:    20 years       BP:           123/61 mmHg Patient Gender: F              HR:           91 bpm. Exam Location:  [facility] Procedure: 2D Echo, Cardiac Doppler  and Color Doppler Indications:     Elevated troponin  History:         Patient has prior history of Echocardiogram examinations, most                  recent 02/01/2019. No medical history on file.  Sonographer:     Cristela Blue RDCS (AE) Referring Phys:  [id number] [facility] Diagnosing Phys: Harold Hedge MD IMPRESSIONS  1. Left ventricular ejection fraction, by estimation, is 65 to 70%. The left ventricle has normal function. The left ventricle has no regional wall motion abnormalities. Left ventricular diastolic parameters were normal.  2. Right ventricular systolic function is normal. The right ventricular size is mildly enlarged.  3. The mitral valve is grossly normal. Trivial mitral valve regurgitation.  4. The aortic valve was not well visualized. Aortic valve regurgitation is not visualized. FINDINGS  Left Ventricle: Left ventricular ejection fraction, by estimation, is 65 to 70%. The left ventricle has normal function. The left ventricle has no regional wall motion abnormalities. The left ventricular internal cavity size was normal in  size. There is  borderline left ventricular hypertrophy. Left ventricular diastolic parameters were normal. Right Ventricle: The right ventricular size is mildly enlarged. No increase in right ventricular wall thickness. Right ventricular systolic function is normal. Left Atrium: Left atrial size was normal in size. Right Atrium: Right atrial size was normal in size. Pericardium: There is no evidence of pericardial effusion. Mitral Valve: The mitral valve is grossly normal. Trivial mitral valve regurgitation. Tricuspid Valve: The tricuspid valve is grossly normal. Tricuspid valve regurgitation is trivial. Aortic Valve: The aortic valve was not well visualized. Aortic valve regurgitation is not visualized. Aortic valve mean gradient measures 5.0 mmHg. Aortic valve peak gradient measures 9.0 mmHg. Aortic valve area, by VTI measures 1.96 cm. Pulmonic Valve: The pulmonic valve was not well visualized. Pulmonic valve regurgitation is trivial. Aorta: The aortic root is normal in size and structure. IAS/Shunts: The interatrial septum was not well visualized.  LEFT VENTRICLE PLAX 2D LVIDd:         4.41 cm  Diastology LVIDs:         2.74 cm  LV e' medial:    13.70 cm/s LV PW:         0.99 cm  LV E/e' medial:  7.3 LV IVS:        0.61 cm  LV e' lateral:   14.80 cm/s LVOT diam:     2.00 cm  LV E/e' lateral: 6.8 LV SV:         54 LV SV Index:   37 LVOT Area:     3.14 cm  RIGHT VENTRICLE RV Basal diam:  2.82 cm RV S prime:     15.80 cm/s TAPSE (M-mode): 3.9 cm LEFT ATRIUM             Index       RIGHT ATRIUM           Index LA diam:        2.90 cm 1.99 cm/m  RA Area:     16.10 cm LA Vol (A2C):   38.9 ml 26.76 ml/m RA Volume:   42.00 ml  28.89 ml/m LA Vol (A4C):   40.9 ml 28.13 ml/m LA Biplane Vol: 41.3 ml 28.41 ml/m  AORTIC VALVE                    PULMONIC VALVE AV Area (Vmax):    2.03  cm     PV Vmax:        1.14 m/s AV Area (Vmean):   2.20 cm     PV Peak grad:   5.2 mmHg AV Area (VTI):     1.96 cm     RVOT Peak grad:  5 mmHg AV Vmax:           150.33 cm/s AV Vmean:          105.433 cm/s AV VTI:            0.278 m AV Peak Grad:      9.0 mmHg AV Mean Grad:      5.0 mmHg LVOT Vmax:         97.30 cm/s LVOT Vmean:        73.800 cm/s LVOT VTI:          0.173 m LVOT/AV VTI ratio: 0.62  AORTA Ao Root diam: 2.80 cm MITRAL VALVE                TRICUSPID VALVE MV Area (PHT): 5.58 cm     TR Peak grad:   13.8 mmHg MV Decel Time: 136 msec     TR Vmax:        186.00 cm/s MV E velocity: 100.00 cm/s MV A velocity: 75.00 cm/s   SHUNTS MV E/A ratio:  1.33         Systemic VTI:  0.17 m                             Systemic Diam: 2.00 cm Harold Hedge MD Electronically signed by Harold Hedge MD Signature Date/Time: 09/03/2020/12:36:13 PM    Final     Labs: BNP (last 3 results) No results for input(s): BNP in the last 8760 hours. Basic Metabolic Panel: Recent Labs  Lab 08/28/20 1933 09/02/20 1526 09/02/20 1852 09/03/20 0503  NA 135 137  --  139  K 3.4* 3.3*  --  3.6  CL 103 99  --  106  CO2 23 26  --  25  GLUCOSE 116* 94  --  95  BUN 12 12  --  8  CREATININE 0.85 0.62 0.60 0.59  CALCIUM 9.2 8.8*  --  8.3*   Liver Function Tests: Recent Labs  Lab 09/02/20 1526 09/03/20 0503  AST 29 21  ALT 18 15  ALKPHOS 70 57  BILITOT 0.5 0.3  PROT 7.1 6.2*  ALBUMIN 3.5 2.9*   No results for input(s): LIPASE, AMYLASE in the last 168 hours. No results for input(s): AMMONIA in the last 168 hours. CBC: Recent Labs  Lab 08/28/20 1933 09/02/20 1526 09/02/20 1852 09/03/20 0503  WBC 9.2 5.5 6.0 6.5  NEUTROABS 7.0 2.9  --   --   HGB 13.9 12.3 11.4* 11.1*  HCT 41.9 36.9 33.9* 33.5*  MCV 85.3 83.9 84.1 85.2  PLT 319 340 319 303   Cardiac Enzymes: No results for input(s): CKTOTAL, CKMB, CKMBINDEX, TROPONINI in the last 168 hours. BNP: Invalid input(s): POCBNP CBG: No results for input(s): GLUCAP in the last 168 hours. D-Dimer No results for input(s): DDIMER in the last 72 hours. Hgb A1c No results for input(s): HGBA1C in  the last 72 hours. Lipid Profile No results for input(s): CHOL, HDL, LDLCALC, TRIG, CHOLHDL, LDLDIRECT in the last 72 hours. Thyroid function studies No results for input(s): TSH, T4TOTAL, T3FREE, THYROIDAB in the last 72 hours.  Invalid input(s): FREET3 Anemia  work up No results for input(s): VITAMINB12, FOLATE, FERRITIN, TIBC, IRON, RETICCTPCT in the last 72 hours. Urinalysis    Component Value Date/Time   COLORURINE YELLOW (A) 09/02/2020 1528   APPEARANCEUR CLOUDY (A) 09/02/2020 1528   LABSPEC 1.028 09/02/2020 1528   PHURINE 5.0 09/02/2020 1528   GLUCOSEU NEGATIVE 09/02/2020 1528   HGBUR NEGATIVE 09/02/2020 1528   BILIRUBINUR NEGATIVE 09/02/2020 1528   KETONESUR NEGATIVE 09/02/2020 1528   PROTEINUR 30 (A) 09/02/2020 1528   NITRITE NEGATIVE 09/02/2020 1528   LEUKOCYTESUR MODERATE (A) 09/02/2020 1528   Sepsis Labs Invalid input(s): PROCALCITONIN,  WBC,  LACTICIDVEN Microbiology Recent Results (from the past 240 hour(s))  Group A Strep by PCR     Status: None   Collection Time: 08/28/20  7:33 PM   Specimen: Urine, Clean Catch; Sterile Swab  Result Value Ref Range Status   Group A Strep by PCR NOT DETECTED NOT DETECTED Final    Comment: Performed at [facility], [address] [place], [place] [address]  Resp Panel by RT-PCR (Flu A&B, Covid) Nasopharyngeal Swab     Status: None   Collection Time: 08/28/20  7:33 PM   Specimen: Nasopharyngeal Swab; Nasopharyngeal(NP) swabs in vial transport medium  Result Value Ref Range Status   SARS Coronavirus 2 by RT PCR NEGATIVE NEGATIVE Final    Comment: (NOTE) SARS-CoV-2 target nucleic acids are NOT DETECTED.  The SARS-CoV-2 RNA is generally detectable in upper respiratory specimens during the acute phase of infection. The lowest concentration of SARS-CoV-2 viral copies this assay can detect is 138 copies/mL. A negative result does not preclude SARS-Cov-2 infection and should not be used as the sole basis for treatment  or other patient management decisions. A negative result may occur with  improper specimen collection/handling, submission of specimen other than nasopharyngeal swab, presence of viral mutation(s) within the areas targeted by this assay, and inadequate number of viral copies(<138 copies/mL). A negative result must be combined with clinical observations, patient history, and epidemiological information. The expected result is Negative.  Fact Sheet for Patients:  BloggerCourse.comhttps://www.fda.gov/media/152166/download  Fact Sheet for Healthcare Providers:  SeriousBroker.ithttps://www.fda.gov/media/152162/download  This test is no t yet approved or cleared by the [place] FDA and  has been authorized for detection and/or diagnosis of SARS-CoV-2 by FDA under an Emergency Use Authorization (EUA). This EUA will remain  in effect (meaning this test can be used) for the duration of the COVID-19 declaration under Section 564(b)(1) of the Act, 21 U.S.C.section 360bbb-3(b)(1), unless the authorization is terminated  or revoked sooner.       Influenza A by PCR NEGATIVE NEGATIVE Final   Influenza B by PCR NEGATIVE NEGATIVE Final    Comment: (NOTE) The Xpert Xpress SARS-CoV-2/FLU/RSV plus assay is intended as an aid in the diagnosis of influenza from Nasopharyngeal swab specimens and should not be used as a sole basis for treatment. Nasal washings and aspirates are unacceptable for Xpert Xpress SARS-CoV-2/FLU/RSV testing.  Fact Sheet for Patients: BloggerCourse.comhttps://www.fda.gov/media/152166/download  Fact Sheet for Healthcare Providers: SeriousBroker.ithttps://www.fda.gov/media/152162/download  This test is not yet approved or cleared by the [place] FDA and has been authorized for detection and/or diagnosis of SARS-CoV-2 by FDA under an Emergency Use Authorization (EUA). This EUA will remain in effect (meaning this test can be used) for the duration of the COVID-19 declaration under Section 564(b)(1) of the Act, 21 U.S.C. section  360bbb-3(b)(1), unless the authorization is terminated or revoked.  Performed at [facility], [address] [place], [place] [address]  Resp Panel by RT-PCR (Flu A&B, Covid) Nasopharyngeal Swab     Status: None   Collection Time: 09/02/20  5:00 PM   Specimen: Nasopharyngeal Swab; Nasopharyngeal(NP) swabs in vial transport medium  Result Value Ref Range Status   SARS Coronavirus 2 by RT PCR NEGATIVE NEGATIVE Final    Comment: (NOTE) SARS-CoV-2 target nucleic acids are NOT DETECTED.  The SARS-CoV-2 RNA is generally detectable in upper respiratory specimens during the acute phase of infection. The lowest concentration of SARS-CoV-2 viral copies this assay can detect is 138 copies/mL. A negative result does not preclude SARS-Cov-2 infection and should not be used as the sole basis for treatment or other patient management decisions. A negative result may occur with  improper specimen collection/handling, submission of specimen other than nasopharyngeal swab, presence of viral mutation(s) within the areas targeted by this assay, and inadequate number of viral copies(<138 copies/mL). A negative result must be combined with clinical observations, patient history, and epidemiological information. The expected result is Negative.  Fact Sheet for Patients:  BloggerCourse.com  Fact Sheet for Healthcare Providers:  SeriousBroker.it  This test is no t yet approved or cleared by the [place] FDA and  has been authorized for detection and/or diagnosis of SARS-CoV-2 by FDA under an Emergency Use Authorization (EUA). This EUA will remain  in effect (meaning this test can be used) for the duration of the COVID-19 declaration under Section 564(b)(1) of the Act, 21 U.S.C.section 360bbb-3(b)(1), unless the authorization is terminated  or revoked sooner.       Influenza A by PCR NEGATIVE NEGATIVE Final   Influenza B by PCR  NEGATIVE NEGATIVE Final    Comment: (NOTE) The Xpert Xpress SARS-CoV-2/FLU/RSV plus assay is intended as an aid in the diagnosis of influenza from Nasopharyngeal swab specimens and should not be used as a sole basis for treatment. Nasal washings and aspirates are unacceptable for Xpert Xpress SARS-CoV-2/FLU/RSV testing.  Fact Sheet for Patients: BloggerCourse.com  Fact Sheet for Healthcare Providers: SeriousBroker.it  This test is not yet approved or cleared by the [place] FDA and has been authorized for detection and/or diagnosis of SARS-CoV-2 by FDA under an Emergency Use Authorization (EUA). This EUA will remain in effect (meaning this test can be used) for the duration of the COVID-19 declaration under Section 564(b)(1) of the Act, 21 U.S.C. section 360bbb-3(b)(1), unless the authorization is terminated or revoked.  Performed at [facility], [address] [place], [place] [address]   Blood culture (routine x 2)     Status: None (Preliminary result)   Collection Time: 09/02/20  5:00 PM   Specimen: BLOOD LEFT FOREARM  Result Value Ref Range Status   Specimen Description BLOOD LEFT FOREARM  Final   Special Requests   Final    BOTTLES DRAWN AEROBIC AND ANAEROBIC Blood Culture results may not be optimal due to an excessive volume of blood received in culture bottles   Culture   Final    NO GROWTH 2 DAYS Performed at [facility] [address], [place], [place] [address]    Report Status PENDING  Incomplete  Blood culture (routine x 2)     Status: None (Preliminary result)   Collection Time: 09/02/20  5:05 PM   Specimen: BLOOD RIGHT FOREARM  Result Value Ref Range Status   Specimen Description BLOOD RIGHT FOREARM  Final   Special Requests   Final    BOTTLES DRAWN AEROBIC AND ANAEROBIC Blood Culture results may not be optimal due to  an excessive volume of blood received in culture bottles    Culture   Final    NO GROWTH 2 DAYS Performed at [facility], [address] [place], [place] [address]    Report Status PENDING  Incomplete     Time coordinating discharge: 25 minutes  SIGNED: Lanae Boast, MD  [facility] 09/04/2020, 11:46 AM  If 7PM-7AM, please contact night-coverage [email or web address]

## 2020-09-04 NOTE — Plan of Care (Signed)
Discharge teaching completed with patient who is in stable condition. 

## 2020-09-04 NOTE — Plan of Care (Signed)
Continuing with plan of care. 

## 2020-09-08 LAB — CULTURE, BLOOD (ROUTINE X 2)
Culture: NO GROWTH
Culture: NO GROWTH

## 2020-09-13 ENCOUNTER — Encounter: Payer: Self-pay | Admitting: Family

## 2020-09-13 ENCOUNTER — Ambulatory Visit: Payer: Managed Care, Other (non HMO) | Admitting: Family

## 2020-09-13 ENCOUNTER — Other Ambulatory Visit: Payer: Self-pay

## 2020-09-13 VITALS — BP 112/66 | HR 67 | Ht 62.0 in | Wt 107.0 lb

## 2020-09-13 DIAGNOSIS — I493 Ventricular premature depolarization: Secondary | ICD-10-CM | POA: Diagnosis not present

## 2020-09-13 DIAGNOSIS — Z8679 Personal history of other diseases of the circulatory system: Secondary | ICD-10-CM

## 2020-09-13 DIAGNOSIS — R002 Palpitations: Secondary | ICD-10-CM

## 2020-09-13 NOTE — Patient Instructions (Signed)
Medication Instructions:  None ordered today.  *If you need a refill on your cardiac medications before your next appointment, please call your pharmacy*   Lab Work: None ordered today.   Testing/Procedures: None ordered today. Your echocardiogram in the hospital looked great!  Follow-Up: At [facility], you and your health needs are our priority.  As part of our continuing mission to provide you with exceptional heart care, we have created designated Provider Care Teams.  These Care Teams include your primary Cardiologist (physician) and Advanced Practice Providers (APPs -  Physician Assistants and Nurse Practitioners) who all work together to provide you with the care you need, when you need it.  We recommend signing up for the patient portal called "MyChart".  Sign up information is provided on this After Visit Summary.  MyChart is used to connect with patients for Virtual Visits (Telemedicine).  Patients are able to view lab/test results, encounter notes, upcoming appointments, etc.  Non-urgent messages can be sent to your provider as well.   To learn more about what you can do with MyChart, go to [email or web address].    Your next appointment:   As needed  Other Instructions  To prevent palpitations: Make sure you are adequately hydrated.  Avoid and/or limit caffeine containing beverages like soda or tea. Exercise regularly.  Manage stress well. Some over the counter medications can cause palpitations such as Benadryl, AdvilPM, TylenolPM. Regular Advil or Tylenol do not cause palpitations.

## 2020-09-13 NOTE — Progress Notes (Signed)
Office Visit    Patient Name: Jaquelynn Wanamaker Date of Encounter: 09/13/2020  PCP:  Rolanda Lundborg, MD   [facility]  Cardiologist:  Lorine Bears, MD  Advanced Practice Provider:  No care team member to display Electrophysiologist:  None   Chief Complaint    Bobbe Quilter is a 20 y.o. female with a hx of palpitations, PVC, myocarditis presents today for hospital follow up.   Past Medical History    No past medical history on file. No past surgical history on file.  Allergies  Allergies  Allergen Reactions  . Penicillins     unknown   . Amoxicillin Rash    History of Present Illness    Azeneth Carbonell is a 20 y.o. female with a hx of palpitations, PVC last seen while hospitalized.  She was hospitalized 06/2018 with acute renal failure shortly after flu and renal failure was thought to be due to dehydration and NSAIDS. Her renal function improved to normal with hydration.   She was seen in consult 01/02/19 by Dr. Kirke Corin due to palpitations and high blood pressure. She wore 3-day ZIO NSR 83 bpm with rare PVC <1% which were associated with patient triggered episode. Echo 02/01/19 LVEF 60-65%, no LVH, normal LV size, RV normal size and function, normal PASP, no significant valvular abnormality.  She was thought to have some element of white coat hypertension and encouraged to purchase BP cuff for home monitoring as she family history significant for hypertension.   She was seen in ED 08/28/20 due to URI symptoms. Following ER visit had episode of chest pain with follow up D-dimer elevated at 0.76. She was seen in the ED with CTA chest 5/5 negative for PE though did show RLL pneumonia and reactive right hilar and subcarinal lymphadenopathy. Bilateral lower extremity duplex with no DVT. Initial HS-troponin 1784 delta and peak 2230 which then down trended. EKG NSR with inferolateral TWI. She was treated with IV antibiotics. Cardiology consulted due to troponin and  though to be acute myocarditis in setting of viral illness. Due to lack of chest pain, lack of pericardial effusion on echo, colchicine was not recommended.  She presents today for follow-up.  She reports feeling overall well since last seen.  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 Reports no shortness of breath nor dyspnea on exertion. Reports no chest pain, pressure, or tightness. No edema, orthopnea, PND. Reports rare isolated palpitations which are not bothersome and not associated with pain nor shortness of breath.  EKGs/Labs/Other Studies Reviewed:   The following studies were reviewed today:  Echo 09/03/20  1. Left ventricular ejection fraction, by estimation, is 65 to 70%. The  left ventricle has normal function. The left ventricle has no regional  wall motion abnormalities. Left ventricular diastolic parameters were  normal.   2. Right ventricular systolic function is normal. The right ventricular  size is mildly enlarged.   3. The mitral valve is grossly normal. Trivial mitral valve  regurgitation.   4. The aortic valve was not well visualized. Aortic valve regurgitation  is not visualized.   EKG:  No EKG today.  Recent Labs: 09/03/2020: ALT 15; BUN 8; Creatinine, Ser 0.59; Hemoglobin 11.1; Platelets 303; Potassium 3.6; Sodium 139  Recent Lipid Panel No results found for: CHOL, TRIG, HDL, CHOLHDL, VLDL,  LDLCALC, LDLDIRECT  Home Medications   Current Meds  Medication Sig  . Clindamycin-Benzoyl Per, Refr, gel 2 (two) times daily.  Marland Kitchen SYEDA 3-0.03 MG tablet Take 1 tablet by mouth daily.  Marland Kitchen tretinoin (RETIN-A) 0.1 % cream Apply 1 application topically at bedtime.     Review of Systems  All other systems reviewed and are otherwise negative except as noted above.  Physical Exam    VS:  BP  112/66   Pulse 67   Ht 5\' 2"  (1.575 m)   Wt 107 lb (48.5 kg)   LMP 08/14/2020 Comment: neg preg test 09/02/20  BMI 19.57 kg/m  , BMI Body mass index is 19.57 kg/m.  Wt Readings from Last 3 Encounters:  09/13/20 107 lb (48.5 kg)  09/02/20 105 lb (47.6 kg)  08/28/20 106 lb (48.1 kg)    GEN: Well nourished, well developed, in no acute distress. HEENT: normal. Neck: Supple, no JVD, carotid bruits, or masses. Cardiac: RRR, no murmurs, rubs, or gallops. No clubbing, cyanosis, edema.  Radials/PT 2+ and equal bilaterally.  Respiratory:  Respirations regular and unlabored, clear to auscultation bilaterally. GI: Soft, nontender, nondistended. MS: No deformity or atrophy. Skin: Warm and dry, no rash. Neuro:  Strength and sensation are intact. Psych: Normal affect.  Assessment & Plan    1. Palpitations / PVC - Reports rare, isolated palpitations which were noted to be PVC on previous monitor. Discussed prevention by avoiding caffeine, alcohol, staying well hydrated, managing stress well. No indication for medical therapy as symptoms are infrequent and not bothersome.   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  If symptoms self resolved during hospitalization she did not require colchicine.  She reports no recurrent chest pain, pressure, tightness since discharge. Cleared her to gradually return to normal exercise regimen.  Disposition: Follow up prn with Dr. 09/2020 or APP.    Signed, Kirke Corin, NP 09/13/2020, 1:52 PM [facility]
